# Patient Record
Sex: Male | Born: 1980
Health system: Southern US, Community
[De-identification: ages and names within clinical notes are randomized; demographics above are authoritative.]

## PROBLEM LIST (undated history)

## (undated) DIAGNOSIS — F4310 Post-traumatic stress disorder, unspecified: Secondary | ICD-10-CM

## (undated) DIAGNOSIS — Z765 Malingerer [conscious simulation]: Secondary | ICD-10-CM

## (undated) DIAGNOSIS — M109 Gout, unspecified: Secondary | ICD-10-CM

## (undated) DIAGNOSIS — Z79891 Long term (current) use of opiate analgesic: Secondary | ICD-10-CM

## (undated) DIAGNOSIS — M549 Dorsalgia, unspecified: Secondary | ICD-10-CM

## (undated) DIAGNOSIS — G8929 Other chronic pain: Secondary | ICD-10-CM

## (undated) HISTORY — PX: KNEE SURGERY: SHX244

## (undated) HISTORY — PX: MIDDLE EAR SURGERY: SHX713

---

## 2011-10-06 ENCOUNTER — Encounter (HOSPITAL_COMMUNITY): Payer: Self-pay | Admitting: Emergency Medicine

## 2011-10-06 ENCOUNTER — Emergency Department (HOSPITAL_COMMUNITY)
Admission: EM | Admit: 2011-10-06 | Discharge: 2011-10-07 | Disposition: A | Discharge status: Home / Self Care | Payer: Medicare Other | Attending: Emergency Medicine | Admitting: Emergency Medicine

## 2011-10-06 DIAGNOSIS — R112 Nausea with vomiting, unspecified: Secondary | ICD-10-CM | POA: Insufficient documentation

## 2011-10-06 DIAGNOSIS — IMO0001 Reserved for inherently not codable concepts without codable children: Secondary | ICD-10-CM | POA: Insufficient documentation

## 2011-10-06 DIAGNOSIS — F4310 Post-traumatic stress disorder, unspecified: Secondary | ICD-10-CM | POA: Insufficient documentation

## 2011-10-06 DIAGNOSIS — R51 Headache: Secondary | ICD-10-CM | POA: Insufficient documentation

## 2011-10-06 HISTORY — DX: Post-traumatic stress disorder, unspecified: F43.10

## 2011-10-06 NOTE — ED Notes (Addendum)
Patient complaining of migraine that started around 0000 today; denies history of migraines. Patient reporting light sensitivity, nausea, and vomiting.  Last emesis around 2300.  Patient states that he has taken Tylenol Extra Strength, which has provided little to no relief.

## 2011-10-07 MED ORDER — SODIUM CHLORIDE 0.9 % IV BOLUS (SEPSIS): 1000.0000 mL | Freq: Once | INTRAVENOUS | Status: DC

## 2011-10-07 MED ORDER — DEXAMETHASONE 4 MG PO TABS
10.0000 mg | ORAL_TABLET | Freq: Once | ORAL | Status: AC
  Administered 2011-10-07: 10 mg via ORAL
  Filled 2011-10-07: qty 1

## 2011-10-07 MED ORDER — NAPROXEN 375 MG PO TABS: 375.0000 mg | ORAL_TABLET | Freq: Two times a day (BID) | ORAL | Status: AC

## 2011-10-07 MED ORDER — DIPHENHYDRAMINE HCL 50 MG/ML IJ SOLN: 25.0000 mg | Freq: Once | INTRAMUSCULAR | Status: DC

## 2011-10-07 MED ORDER — DIPHENHYDRAMINE HCL 25 MG PO CAPS
50.0000 mg | ORAL_CAPSULE | Freq: Once | ORAL | Status: AC
  Administered 2011-10-07: 50 mg via ORAL
  Filled 2011-10-07: qty 2

## 2011-10-07 MED ORDER — DEXAMETHASONE SODIUM PHOSPHATE 4 MG/ML IJ SOLN: 10.0000 mg | Freq: Once | INTRAMUSCULAR | Status: DC

## 2011-10-07 MED ORDER — METOCLOPRAMIDE HCL 5 MG/ML IJ SOLN: 10.0000 mg | Freq: Once | INTRAMUSCULAR | Status: DC

## 2011-10-07 MED ORDER — METOCLOPRAMIDE HCL 10 MG PO TABS
10.0000 mg | ORAL_TABLET | Freq: Once | ORAL | Status: AC
  Administered 2011-10-07: 10 mg via ORAL
  Filled 2011-10-07: qty 1

## 2011-10-07 NOTE — Discharge Instructions (Signed)

## 2011-10-07 NOTE — ED Provider Notes (Signed)
History     CSN: [id number]  Arrival date & time 10/06/11  2301   None     Chief Complaint  Patient presents with  . Migraine    (Consider location/radiation/quality/duration/timing/severity/associated sxs/prior treatment) Patient is a 31 y.o. male presenting with migraine.  Migraine Associated symptoms include headaches. Pertinent negatives include no chest pain, no abdominal pain and no shortness of breath.   History provider the patient. Headache started at 12 noon today take extra Tylenol with no relief. Patient has nausea vomiting light bothers his eyes. He is not formally been diagnosed with migraines complains of a migraine-like headache. Gradual onset not worst headache of life. Is moderate to severe. Location right temporal and throbbing in quality without radiation. No weakness or numbness. No fevers or chills. No weakness or numbness. No difficulty with speech or gait. Patient declines an IV is requesting pills and to be discharged home. No known aggravating or alleviating factors otherwise. No anticoagulants. No trauma. Past Medical History  Diagnosis Date  . PTSD (post-traumatic stress disorder)     History reviewed. No pertinent past surgical history.  History reviewed. No pertinent family history.  History  Substance Use Topics  . Smoking status: Current Everyday Smoker -- 0.5 packs/day    Types: Cigarettes  . Smokeless tobacco: Not on file  . Alcohol Use: No      Review of Systems  Constitutional: Negative for fever and chills.  HENT: Negative for neck pain and neck stiffness.   Eyes: Negative for pain.  Respiratory: Negative for shortness of breath.   Cardiovascular: Negative for chest pain.  Gastrointestinal: Negative for abdominal pain.  Genitourinary: Negative for dysuria.  Musculoskeletal: Negative for back pain.  Skin: Negative for rash.  Neurological: Positive for headaches. Negative for tremors, seizures, syncope, speech difficulty, weakness,  light-headedness and numbness.  All other systems reviewed and are negative.    Allergies  Amoxicillin and Penicillins  Home Medications   Current Outpatient Rx  Name Route Sig Dispense Refill  . ALPRAZOLAM 2 MG PO TABS Oral Take 2 mg by mouth at bedtime as needed. For anxiety      BP 124/77  Pulse 66  Temp(Src) 98.3 F (36.8 C) (Oral)  Resp 19  SpO2 99%  Physical Exam  Constitutional: He is oriented to person, place, and time. He appears well-developed and well-nourished.  HENT:  Head: Normocephalic and atraumatic.  Eyes: Conjunctivae and EOM are normal. Pupils are equal, round, and reactive to light.  Neck: Full passive range of motion without pain. Neck supple. No thyromegaly present.       No meningismus  Cardiovascular: Normal rate, regular rhythm, S1 normal, S2 normal and intact distal pulses.   Pulmonary/Chest: Effort normal and breath sounds normal.  Abdominal: Soft. Bowel sounds are normal. There is no tenderness. There is no CVA tenderness.  Musculoskeletal: Normal range of motion.  Neurological: He is alert and oriented to person, place, and time. He has normal strength and normal reflexes. No cranial nerve deficit or sensory deficit. He displays a negative Romberg sign. GCS eye subscore is 4. GCS verbal subscore is 5. GCS motor subscore is 6.       Normal Gait  Skin: Skin is warm and dry. No rash noted. No cyanosis. Nails show no clubbing.  Psychiatric: He has a normal mood and affect. His speech is normal and behavior is normal.    ED Course  Procedures (including critical care time)  IV and further workup declined  Patient provided  pills/ HA cocktail   MDM   Headache without neuro deficits or features to suggest meningitis, subarachnoid hemorrhage or infectious etiology otherwise. Vital signs within normal limits. Medications provided and patient agrees to all discharge and followup instructions, in addition to, strict return precautions. PCP referral  provided        Sunnie Nielsen, MD 10/07/11 [phone]

## 2011-10-07 NOTE — ED Notes (Signed)
Pt c/o headache x's 24 hrs.  Nausea and vomiting. Denies any head injury

## 2012-04-02 ENCOUNTER — Encounter (HOSPITAL_BASED_OUTPATIENT_CLINIC_OR_DEPARTMENT_OTHER): Payer: Self-pay | Admitting: *Deleted

## 2012-04-02 ENCOUNTER — Emergency Department (HOSPITAL_BASED_OUTPATIENT_CLINIC_OR_DEPARTMENT_OTHER)
Admission: EM | Admit: 2012-04-02 | Discharge: 2012-04-03 | Disposition: A | Discharge status: Home / Self Care | Payer: Medicare Other | Attending: Emergency Medicine | Admitting: Emergency Medicine

## 2012-04-02 DIAGNOSIS — L299 Pruritus, unspecified: Secondary | ICD-10-CM

## 2012-04-02 DIAGNOSIS — W57XXXA Bitten or stung by nonvenomous insect and other nonvenomous arthropods, initial encounter: Secondary | ICD-10-CM | POA: Insufficient documentation

## 2012-04-02 DIAGNOSIS — T148 Other injury of unspecified body region: Secondary | ICD-10-CM | POA: Insufficient documentation

## 2012-04-02 DIAGNOSIS — F4310 Post-traumatic stress disorder, unspecified: Secondary | ICD-10-CM | POA: Insufficient documentation

## 2012-04-02 DIAGNOSIS — F17200 Nicotine dependence, unspecified, uncomplicated: Secondary | ICD-10-CM | POA: Insufficient documentation

## 2012-04-02 DIAGNOSIS — Z880 Allergy status to penicillin: Secondary | ICD-10-CM | POA: Insufficient documentation

## 2012-04-02 NOTE — ED Notes (Signed)
Pt states he was bitten by a bug around 6p. C/O bites to neck and shoulder as well as right arm.

## 2012-04-03 MED ORDER — DIPHENHYDRAMINE HCL 25 MG PO TABS: 25.0000 mg | ORAL_TABLET | Freq: Four times a day (QID) | ORAL | Status: DC

## 2012-04-03 MED ORDER — DIPHENHYDRAMINE HCL 25 MG PO CAPS
25.0000 mg | ORAL_CAPSULE | Freq: Once | ORAL | Status: AC
  Administered 2012-04-03: 25 mg via ORAL
  Filled 2012-04-03: qty 1

## 2012-04-03 NOTE — ED Provider Notes (Signed)
History  This chart was scribed for Jeffrey Chad, MD by Ladona Ridgel Day. This patient was seen in room MH10/MH10 and the patient's care was started at 2317.   CSN: [id number]  Arrival date & time 04/02/12  2317   First MD Initiated Contact with Patient 04/02/12 2349      Chief Complaint  Patient presents with  . Insect Bite   Patient is a 31 y.o. male presenting with rash. The history is provided by the patient. No language interpreter was used.  Rash  This is a new problem. The current episode started 6 to 12 hours ago. The problem has not changed since onset.The problem is associated with an insect bite/sting. There has been no fever. The rash is present on the right arm and neck. The patient is experiencing no pain. Associated symptoms include itching. He has tried nothing for the symptoms.   [name] is a 31 y.o. male who presents to the Emergency Department complaining of an insect bite to his neck, shoulder and right arm about 6 hours ago while he was outside and has been itching since. He presents with the insect in a cup but is unsure of what kind of bug it is. He states he has been itching the site of where he was bitten but denies any SOB, throat or tongue swelling.   Past Medical History  Diagnosis Date  . PTSD (post-traumatic stress disorder)     History reviewed. No pertinent past surgical history.  History reviewed. No pertinent family history.  History  Substance Use Topics  . Smoking status: Current Every Day Smoker -- 0.5 packs/day    Types: Cigarettes  . Smokeless tobacco: Not on file  . Alcohol Use: No      Review of Systems  Constitutional: Negative for fever and chills.  Respiratory: Negative for shortness of breath.   Cardiovascular: Negative for chest pain.  Gastrointestinal: Negative for nausea and vomiting.  Skin: Positive for itching and rash.       Itchiness over neck, shoulder and left arm where insect bite occurred.   Neurological:  Negative for weakness.  All other systems reviewed and are negative.    Allergies  Amoxicillin; Penicillins; and Tylenol  Home Medications   Current Outpatient Rx  Name Route Sig Dispense Refill  . ALPRAZOLAM 2 MG PO TABS Oral Take 2 mg by mouth at bedtime as needed. For anxiety    . NAPROXEN 375 MG PO TABS Oral Take 1 tablet (375 mg total) by mouth 2 (two) times daily. 20 tablet 0    Triage Vitals: BP 127/74  Pulse 88  Temp 98.6 F (37 C) (Oral)  Resp 20  Ht 5\' 7"  (1.702 m)  Wt 158 lb (71.668 kg)  BMI 24.75 kg/m2  SpO2 98%  Physical Exam  Nursing note and vitals reviewed. Constitutional: He is oriented to person, place, and time. He appears well-developed and well-nourished. No distress.  HENT:  Head: Normocephalic and atraumatic.  Mouth/Throat: Oropharynx is clear and moist.  Eyes: Conjunctivae normal and EOM are normal.  Neck: Neck supple. No tracheal deviation present.  Cardiovascular: Normal rate, regular rhythm, normal heart sounds and intact distal pulses.   No murmur heard. Pulmonary/Chest: Effort normal and breath sounds normal. No respiratory distress. He has no wheezes. He has no rales.  Abdominal: Soft. Bowel sounds are normal. He exhibits no distension. There is no tenderness. There is no rebound and no guarding.  Musculoskeletal: Normal range of motion.  Neurological: He  is alert and oriented to person, place, and time.  Skin: Skin is warm and dry.       Mild skin excoriation on anterior surface of neck from where he has been itching himself. No skin breaks over anterior neck.   Psychiatric: He has a normal mood and affect. His behavior is normal.    ED Course  Procedures (including critical care time) DIAGNOSTIC STUDIES: Oxygen Saturation is 98% on room air, normal by my interpretation.    COORDINATION OF CARE: At 1205 PM Discussed treatment plan with patient which includes benadryl. Patient agrees.   Labs Reviewed - No data to display No results  found.   No diagnosis found.    MDM  Pt presents for evaluation after a bug bite.  He has a live 6-legged bug in a specimen cup.  IOt is not an ant, spider, or winged insect.  Note no skin breaks or erythema on the left/ant neck where he states he was bit.  Pt appears comfortable and has no evidence on exam of a systeming reaction.  He has no respiratory insufficiency.  Plan symptomatic care.  Discussed indications for immediate return to the ER.   I personally performed the services described in this documentation, which was scribed in my presence. The recorded information has been reviewed and considered.          Jeffrey Chad, MD 04/03/12 [phone]

## 2014-08-14 ENCOUNTER — Emergency Department (HOSPITAL_BASED_OUTPATIENT_CLINIC_OR_DEPARTMENT_OTHER)
Admission: EM | Admit: 2014-08-14 | Discharge: 2014-08-14 | Disposition: A | Discharge status: Home / Self Care | Payer: Medicare Other | Attending: Emergency Medicine | Admitting: Emergency Medicine

## 2014-08-14 ENCOUNTER — Encounter (HOSPITAL_BASED_OUTPATIENT_CLINIC_OR_DEPARTMENT_OTHER): Payer: Self-pay

## 2014-08-14 DIAGNOSIS — Z720 Tobacco use: Secondary | ICD-10-CM | POA: Diagnosis not present

## 2014-08-14 DIAGNOSIS — Z880 Allergy status to penicillin: Secondary | ICD-10-CM | POA: Insufficient documentation

## 2014-08-14 DIAGNOSIS — Z8659 Personal history of other mental and behavioral disorders: Secondary | ICD-10-CM | POA: Diagnosis not present

## 2014-08-14 DIAGNOSIS — K047 Periapical abscess without sinus: Secondary | ICD-10-CM

## 2014-08-14 DIAGNOSIS — K029 Dental caries, unspecified: Secondary | ICD-10-CM | POA: Diagnosis not present

## 2014-08-14 DIAGNOSIS — K0889 Other specified disorders of teeth and supporting structures: Secondary | ICD-10-CM

## 2014-08-14 DIAGNOSIS — K088 Other specified disorders of teeth and supporting structures: Secondary | ICD-10-CM | POA: Insufficient documentation

## 2014-08-14 MED ORDER — CLINDAMYCIN HCL 300 MG PO CAPS: 300.0000 mg | ORAL_CAPSULE | Freq: Four times a day (QID) | ORAL | Status: DC

## 2014-08-14 MED ORDER — TRAMADOL HCL 50 MG PO TABS: 50.0000 mg | ORAL_TABLET | Freq: Four times a day (QID) | ORAL | Status: DC | PRN

## 2014-08-14 MED ORDER — KETOROLAC TROMETHAMINE 60 MG/2ML IM SOLN
60.0000 mg | Freq: Once | INTRAMUSCULAR | Status: AC
  Administered 2014-08-14: 60 mg via INTRAMUSCULAR
  Filled 2014-08-14: qty 2

## 2014-08-14 NOTE — ED Notes (Signed)
Left upper toothache x 2 days.  

## 2014-08-14 NOTE — Discharge Instructions (Signed)
Take clindamycin as prescribed for 1 week. Take tramadol as directed for pain. Follow up with the dentist.  Dental Caries Dental caries (also called tooth decay) is the most common oral disease. It can occur at any age but is more common in children and young adults.  HOW DENTAL CARIES DEVELOPS  The process of decay begins when bacteria and foods (particularly sugars and starches) combine in your mouth to produce plaque. Plaque is a substance that sticks to the hard, outer surface of a tooth (enamel). The bacteria in plaque produce acids that attack enamel. These acids may also attack the root surface of a tooth (cementum) if it is exposed. Repeated attacks dissolve these surfaces and create holes in the tooth (cavities). If left untreated, the acids destroy the other layers of the tooth.  RISK FACTORS  Frequent sipping of sugary beverages.   Frequent snacking on sugary and starchy foods, especially those that easily get stuck in the teeth.   Poor oral hygiene.   Dry mouth.   Substance abuse such as methamphetamine abuse.   Broken or poor-fitting dental restorations.   Eating disorders.   Gastroesophageal reflux disease (GERD).   Certain radiation treatments to the head and neck. SYMPTOMS In the early stages of dental caries, symptoms are seldom present. Sometimes white, chalky areas may be seen on the enamel or other tooth layers. In later stages, symptoms may include:  Pits and holes on the enamel.  Toothache after sweet, hot, or cold foods or drinks are consumed.  Pain around the tooth.  Swelling around the tooth. DIAGNOSIS  Most of the time, dental caries is detected during a regular dental checkup. A diagnosis is made after a thorough medical and dental history is taken and the surfaces of your teeth are checked for signs of dental caries. Sometimes special instruments, such as lasers, are used to check for dental caries. Dental X-ray exams may be taken so that areas  not visible to the eye (such as between the contact areas of the teeth) can be checked for cavities.  TREATMENT  If dental caries is in its early stages, it may be reversed with a fluoride treatment or an application of a remineralizing agent at the dental office. Thorough brushing and flossing at home is needed to aid these treatments. If it is in its later stages, treatment depends on the location and extent of tooth destruction:   If a small area of the tooth has been destroyed, the destroyed area will be removed and cavities will be filled with a material such as gold, silver amalgam, or composite resin.   If a large area of the tooth has been destroyed, the destroyed area will be removed and a cap (crown) will be fitted over the remaining tooth structure.   If the center part of the tooth (pulp) is affected, a procedure called a root canal will be needed before a filling or crown can be placed.   If most of the tooth has been destroyed, the tooth may need to be pulled (extracted). HOME CARE INSTRUCTIONS You can prevent, stop, or reverse dental caries at home by practicing good oral hygiene. Good oral hygiene includes:  Thoroughly cleaning your teeth at least twice a day with a toothbrush and dental floss.   Using a fluoride toothpaste. A fluoride mouth rinse may also be used if recommended by your dentist or health care provider.   Restricting the amount of sugary and starchy foods and sugary liquids you consume.  Avoiding frequent snacking on these foods and sipping of these liquids.   Keeping regular visits with a dentist for checkups and cleanings. PREVENTION   Practice good oral hygiene.  Consider a dental sealant. A dental sealant is a coating material that is applied by your dentist to the pits and grooves of teeth. The sealant prevents food from being trapped in them. It may protect the teeth for several years.  Ask about fluoride supplements if you live in a  community without fluorinated water or with water that has a low fluoride content. Use fluoride supplements as directed by your dentist or health care provider.  Allow fluoride varnish applications to teeth if directed by your dentist or health care provider. Document Released: 02/28/2002 Document Revised: 10/23/2013 Document Reviewed: 06/10/2012 [facility] Patient Information 2015 [place], [place]. This information is not intended to replace advice given to you by your health care provider. Make sure you discuss any questions you have with your health care provider.  Dental Pain A tooth ache may be caused by cavities (tooth decay). Cavities expose the nerve of the tooth to air and hot or cold temperatures. It may come from an infection or abscess (also called a boil or furuncle) around your tooth. It is also often caused by dental caries (tooth decay). This causes the pain you are having. DIAGNOSIS  Your caregiver can diagnose this problem by exam. TREATMENT   If caused by an infection, it may be treated with medications which kill germs (antibiotics) and pain medications as prescribed by your caregiver. Take medications as directed.  Only take over-the-counter or prescription medicines for pain, discomfort, or fever as directed by your caregiver.  Whether the tooth ache today is caused by infection or dental disease, you should see your dentist as soon as possible for further care. SEEK MEDICAL CARE IF: The exam and treatment you received today has been provided on an emergency basis only. This is not a substitute for complete medical or dental care. If your problem worsens or new problems (symptoms) appear, and you are unable to meet with your dentist, call or return to this location. SEEK IMMEDIATE MEDICAL CARE IF:   You have a fever.  You develop redness and swelling of your face, jaw, or neck.  You are unable to open your mouth.  You have severe pain uncontrolled by pain medicine. MAKE  SURE YOU:   Understand these instructions.  Will watch your condition.  Will get help right away if you are not doing well or get worse. Document Released: 06/08/2005 Document Revised: 08/31/2011 Document Reviewed: 01/25/2008 [facility]ExitCare Patient Information 2015 [place], [place]. This information is not intended to replace advice given to you by your health care provider. Make sure you discuss any questions you have with your health care provider.

## 2014-08-14 NOTE — ED Provider Notes (Signed)
CSN: [id number]     Arrival date & time 08/14/14  1757 History   First MD Initiated Contact with Patient 08/14/14 1822     Chief Complaint  Patient presents with  . Dental Pain     (Consider location/radiation/quality/duration/timing/severity/associated sxs/prior Treatment) HPI Comments: 34 year old male presenting with left-sided dental pain 2 days. Pain worse with chewing. He has tried over-the-counter medication with no relief. States his face feels swollen on the left side. He does not have a dentist. He is a smoker. Denies difficulty swallowing.  Patient is a 34 y.o. male presenting with tooth pain. The history is provided by the patient.  Dental Pain Associated symptoms: facial swelling     Past Medical History  Diagnosis Date  . PTSD (post-traumatic stress disorder)    History reviewed. No pertinent past surgical history. No family history on file. History  Substance Use Topics  . Smoking status: Current Every Day Smoker -- 0.50 packs/day    Types: Cigarettes  . Smokeless tobacco: Not on file  . Alcohol Use: No    Review of Systems  Constitutional: Negative.   HENT: Positive for dental problem and facial swelling.   Respiratory: Negative for choking.   Gastrointestinal: Negative for vomiting.  Skin: Negative.       Allergies  Amoxicillin; Penicillins; and Tylenol  Home Medications   Prior to Admission medications   Medication Sig Start Date End Date Taking? Authorizing Provider  clindamycin (CLEOCIN) 300 MG capsule Take 1 capsule (300 mg total) by mouth 4 (four) times daily. X 7 days 08/14/14   Kathrynn Speedobyn M Tayten Heber, PA-C  traMADol (ULTRAM) 50 MG tablet Take 1 tablet (50 mg total) by mouth every 6 (six) hours as needed. 08/14/14   Curtiss Mahmood M Mikaelyn Arthurs, PA-C   BP 131/73 mmHg  Pulse 92  Temp(Src) 98 F (36.7 C) (Oral)  Resp 16  Ht 5\' 7"  (1.702 m)  Wt 185 lb (83.915 kg)  BMI 28.97 kg/m2  SpO2 100% Physical Exam  Constitutional: He is oriented to person, place, and time.  He appears well-developed and well-nourished. No distress.  HENT:  Head: Normocephalic and atraumatic.  Mouth/Throat: Uvula is midline.    Poor dentition. Multiple dental caries.  Eyes: Conjunctivae and EOM are normal.  Neck: Normal range of motion. Neck supple.  Cardiovascular: Normal rate, regular rhythm and normal heart sounds.   Pulmonary/Chest: Effort normal and breath sounds normal.  Musculoskeletal: Normal range of motion. He exhibits no edema.  Neurological: He is alert and oriented to person, place, and time.  Skin: Skin is warm and dry.  Psychiatric: He has a normal mood and affect. His behavior is normal.  Nursing note and vitals reviewed.   ED Course  Procedures (including critical care time) Labs Review Labs Reviewed - No data to display  Imaging Review No results found.   EKG Interpretation None      MDM   Final diagnoses:  Pain, dental  Dental infection  Dental caries    Dental pain associated with dental infection. No evidence of dental abscess. Patient is afebrile, non toxic appearing and swallowing secretions well. I gave patient referral to dentist and stressed the importance of dental follow up for ultimate management of dental pain. I will also give clinda (PCN allergy) and pain control. Patient voices understanding and is agreeable to plan.   Kathrynn SpeedRobyn M Mirl Hillery, PA-C 08/14/14 1831  Rolland PorterMark James, MD 08/15/14 [phone]

## 2014-09-10 ENCOUNTER — Emergency Department (HOSPITAL_BASED_OUTPATIENT_CLINIC_OR_DEPARTMENT_OTHER)
Admission: EM | Admit: 2014-09-10 | Discharge: 2014-09-10 | Disposition: A | Discharge status: Home / Self Care | Payer: Medicare Other

## 2014-09-10 NOTE — ED Notes (Signed)
No answer for triage, unable to find.

## 2014-09-10 NOTE — ED Notes (Signed)
No answer

## 2014-11-26 ENCOUNTER — Encounter (HOSPITAL_BASED_OUTPATIENT_CLINIC_OR_DEPARTMENT_OTHER): Payer: Self-pay | Admitting: *Deleted

## 2014-11-26 ENCOUNTER — Emergency Department (HOSPITAL_BASED_OUTPATIENT_CLINIC_OR_DEPARTMENT_OTHER)
Admission: EM | Admit: 2014-11-26 | Discharge: 2014-11-26 | Disposition: A | Discharge status: Home / Self Care | Payer: Medicare Other | Attending: Emergency Medicine | Admitting: Emergency Medicine

## 2014-11-26 DIAGNOSIS — K0889 Other specified disorders of teeth and supporting structures: Secondary | ICD-10-CM

## 2014-11-26 DIAGNOSIS — Z792 Long term (current) use of antibiotics: Secondary | ICD-10-CM | POA: Insufficient documentation

## 2014-11-26 DIAGNOSIS — Z720 Tobacco use: Secondary | ICD-10-CM | POA: Insufficient documentation

## 2014-11-26 DIAGNOSIS — Z880 Allergy status to penicillin: Secondary | ICD-10-CM | POA: Diagnosis not present

## 2014-11-26 DIAGNOSIS — K088 Other specified disorders of teeth and supporting structures: Secondary | ICD-10-CM | POA: Diagnosis present

## 2014-11-26 DIAGNOSIS — K029 Dental caries, unspecified: Secondary | ICD-10-CM | POA: Insufficient documentation

## 2014-11-26 DIAGNOSIS — Z8659 Personal history of other mental and behavioral disorders: Secondary | ICD-10-CM | POA: Insufficient documentation

## 2014-11-26 MED ORDER — TRAMADOL HCL 50 MG PO TABS: 50.0000 mg | ORAL_TABLET | Freq: Four times a day (QID) | ORAL | Status: DC | PRN

## 2014-11-26 MED ORDER — CLINDAMYCIN HCL 150 MG PO CAPS: 300.0000 mg | ORAL_CAPSULE | Freq: Three times a day (TID) | ORAL | Status: DC

## 2014-11-26 MED ORDER — TRAMADOL HCL 50 MG PO TABS
50.0000 mg | ORAL_TABLET | Freq: Once | ORAL | Status: AC
  Administered 2014-11-26: 50 mg via ORAL
  Filled 2014-11-26: qty 1

## 2014-11-26 MED ORDER — CLINDAMYCIN HCL 150 MG PO CAPS
300.0000 mg | ORAL_CAPSULE | Freq: Once | ORAL | Status: AC
  Administered 2014-11-26: 300 mg via ORAL
  Filled 2014-11-26: qty 2

## 2014-11-26 NOTE — ED Provider Notes (Signed)
CSN: [id number]     Arrival date & time 11/26/14  1226 History   First MD Initiated Contact with Patient 11/26/14 1342     Chief Complaint  Patient presents with  . Dental Pain     (Consider location/radiation/quality/duration/timing/severity/associated sxs/prior Treatment) HPI Comments: Patient is a 34 year old male past medical history significant for tobacco abuse presenting to the emergency department for continued upper dental pain without radiation. Describes pain as sharp throbbing. Has tried Tylenol with no improvement. Denies any fevers, shortness breath, difficulty swallowing. Patient has not followed up with a dentist yet. Modifying factors identified.  Patient is a 34 y.o. male presenting with tooth pain.  Dental Pain Location:  Upper Quality:  Sharp and throbbing Timing:  Constant Progression:  Unchanged Chronicity:  Recurrent Context: dental caries, dental fracture and poor dentition   Relieved by:  Nothing Worsened by:  Cold food/drink and hot food/drink Ineffective treatments:  Acetaminophen Risk factors: lack of dental care and smoking     Past Medical History  Diagnosis Date  . PTSD (post-traumatic stress disorder)    History reviewed. No pertinent past surgical history. No family history on file. History  Substance Use Topics  . Smoking status: Current Every Day Smoker -- 0.50 packs/day    Types: Cigarettes  . Smokeless tobacco: Not on file  . Alcohol Use: No    Review of Systems  HENT: Positive for dental problem.   All other systems reviewed and are negative.     Allergies  Amoxicillin; Penicillins; and Tylenol  Home Medications   Prior to Admission medications   Medication Sig Start Date End Date Taking? Authorizing Provider  clindamycin (CLEOCIN) 150 MG capsule Take 2 capsules (300 mg total) by mouth 3 (three) times daily. May dispense as  capsules 11/26/14   Francee Piccolo, PA-C  clindamycin (CLEOCIN) 300 MG capsule Take 1 capsule  (300 mg total) by mouth 4 (four) times daily. X 7 days 08/14/14   Kathrynn Speed, PA-C  traMADol (ULTRAM) 50 MG tablet Take 1 tablet (50 mg total) by mouth every 6 (six) hours as needed. 08/14/14   Kathrynn Speed, PA-C  traMADol (ULTRAM) 50 MG tablet Take 1 tablet (50 mg total) by mouth every 6 (six) hours as needed. 11/26/14   Alexi Geibel, PA-C   BP 127/80 mmHg  Pulse 77  Temp(Src) 97.9 F (36.6 C) (Oral)  Resp 20  Ht  (1.702 m)  Wt 195 lb (88.451 kg)  BMI 30.53 kg/m2  SpO2 97% Physical Exam  Constitutional: He is oriented to person, place, and time. He appears well-developed and well-nourished. No distress.  HENT:  Head: Normocephalic and atraumatic.  Right Ear: External ear normal.  Left Ear: External ear normal.  Nose: Nose normal.  Mouth/Throat: Uvula is midline and mucous membranes are normal. No trismus in the jaw. Abnormal dentition. Dental caries present. No uvula swelling. No oropharyngeal exudate.  Submental and sublingual spaces are soft.  Eyes: Conjunctivae are normal.  Neck: Normal range of motion. Neck supple.  Cardiovascular: Normal rate, regular rhythm and normal heart sounds.   Pulmonary/Chest: Effort normal and breath sounds normal.  Neurological: He is alert and oriented to person, place, and time.  Skin: Skin is warm and dry. He is not diaphoretic.  Psychiatric: He has a normal mood and affect.  Nursing note and vitals reviewed.   ED Course  Procedures (including critical care time) Medications  traMADol (ULTRAM) tablet 50 mg (50 mg Oral Given 11/26/14 1423)  clindamycin (  CLEOCIN) capsule 300 mg (300 mg Oral Given 11/26/14 1423)    Labs Review Labs Reviewed - No data to display  Imaging Review No results found.   EKG Interpretation None      MDM   Final diagnoses:  Pain, dental    Filed Vitals:   11/26/14 1231  BP: 127/80  Pulse: 77  Temp: 97.9 F (36.6 C)  Resp: 20   Afebrile, NAD, non-toxic appearing, AAOx4.   Patient with  toothache.  No gross abscess.  Exam unconcerning for Ludwig's angina or spread of infection.  Will treat with clindamycinand pain medicine.  Urged patient to follow-up with dentist.   Patient is stable at time of discharge    Francee PiccoloJennifer Nimesh Riolo, PA-C 11/26/14 1430  Toy CookeyMegan Docherty, MD 11/26/14 [phone]

## 2014-11-26 NOTE — Discharge Instructions (Signed)
Please follow up with Dr. Mayford Knifeurner to schedule a follow up appointment. Please take your antibiotic until completion. Please take pain medication and/or muscle relaxants as prescribed and as needed for pain. Please do not drive on narcotic pain medication or on muscle relaxants. Please read all discharge instructions and return precautions.    Dental Pain A tooth ache may be caused by cavities (tooth decay). Cavities expose the nerve of the tooth to air and hot or cold temperatures. It may come from an infection or abscess (also called a boil or furuncle) around your tooth. It is also often caused by dental caries (tooth decay). This causes the pain you are having. DIAGNOSIS  Your caregiver can diagnose this problem by exam. TREATMENT   If caused by an infection, it may be treated with medications which kill germs (antibiotics) and pain medications as prescribed by your caregiver. Take medications as directed.  Only take over-the-counter or prescription medicines for pain, discomfort, or fever as directed by your caregiver.  Whether the tooth ache today is caused by infection or dental disease, you should see your dentist as soon as possible for further care. SEEK MEDICAL CARE IF: The exam and treatment you received today has been provided on an emergency basis only. This is not a substitute for complete medical or dental care. If your problem worsens or new problems (symptoms) appear, and you are unable to meet with your dentist, call or return to this location. SEEK IMMEDIATE MEDICAL CARE IF:   You have a fever.  You develop redness and swelling of your face, jaw, or neck.  You are unable to open your mouth.  You have severe pain uncontrolled by pain medicine. MAKE SURE YOU:   Understand these instructions.  Will watch your condition.  Will get help right away if you are not doing well or get worse. Document Released: 06/08/2005 Document Revised: 08/31/2011 Document Reviewed:  01/25/2008 [facility] Patient Information 2015 [employer], [place]. This information is not intended to replace advice given to you by your health care provider. Make sure you discuss any questions you have with your health care provider.

## 2014-11-26 NOTE — ED Notes (Signed)
Dental pain. 

## 2015-01-19 ENCOUNTER — Encounter (HOSPITAL_BASED_OUTPATIENT_CLINIC_OR_DEPARTMENT_OTHER): Payer: Self-pay | Admitting: Emergency Medicine

## 2015-01-19 ENCOUNTER — Emergency Department (HOSPITAL_BASED_OUTPATIENT_CLINIC_OR_DEPARTMENT_OTHER)
Admission: EM | Admit: 2015-01-19 | Discharge: 2015-01-19 | Disposition: A | Discharge status: Home / Self Care | Payer: Medicare Other | Attending: Emergency Medicine | Admitting: Emergency Medicine

## 2015-01-19 DIAGNOSIS — Z880 Allergy status to penicillin: Secondary | ICD-10-CM | POA: Insufficient documentation

## 2015-01-19 DIAGNOSIS — Z720 Tobacco use: Secondary | ICD-10-CM | POA: Insufficient documentation

## 2015-01-19 DIAGNOSIS — Z8659 Personal history of other mental and behavioral disorders: Secondary | ICD-10-CM | POA: Insufficient documentation

## 2015-01-19 DIAGNOSIS — K0889 Other specified disorders of teeth and supporting structures: Secondary | ICD-10-CM

## 2015-01-19 DIAGNOSIS — K088 Other specified disorders of teeth and supporting structures: Secondary | ICD-10-CM | POA: Diagnosis present

## 2015-01-19 DIAGNOSIS — K08409 Partial loss of teeth, unspecified cause, unspecified class: Secondary | ICD-10-CM | POA: Diagnosis not present

## 2015-01-19 MED ORDER — CLINDAMYCIN HCL 150 MG PO CAPS: 150.0000 mg | ORAL_CAPSULE | Freq: Four times a day (QID) | ORAL | Status: DC

## 2015-01-19 MED ORDER — CLINDAMYCIN HCL 150 MG PO CAPS
150.0000 mg | ORAL_CAPSULE | Freq: Once | ORAL | Status: AC
  Administered 2015-01-19: 150 mg via ORAL
  Filled 2015-01-19: qty 1

## 2015-01-19 NOTE — ED Notes (Signed)
Patient had wisdom tooth removed on Tuesday.  Patient c/o continued pain and inability to eat.

## 2015-01-19 NOTE — ED Notes (Signed)
Discharge instructions and prescriptions given and reviewed with patient.  Patient verbalized understanding to take medications as directed and to follow up with dentist as needed.  Patient discharged home in good condition.

## 2015-01-19 NOTE — ED Provider Notes (Signed)
CSN: [id number]     Arrival date & time 01/19/15  4098 History   First MD Initiated Contact with Jeffrey Crawford 01/19/15 0403     Chief Complaint  Jeffrey Crawford presents with  . Dental Pain     (Consider location/radiation/quality/duration/timing/severity/associated sxs/prior Treatment) Jeffrey Crawford is a 34 y.o. male presenting with tooth pain. The history is provided by the Jeffrey Crawford.  Dental Pain He states that he had his left upper wisdom tooth removed 3 days ago and he is continuing to have pain. He states he was told to eat soft foods for 2 days and then eat anything that he wanted. However, when he she is on that side, it is painful. He rates pain at 9/10. He told me that the dentist had given him Lortab 5 mg but it is not helping the pain.  Past Medical History  Diagnosis Date  . PTSD (post-traumatic stress disorder)    History reviewed. No pertinent past surgical history. No family history on file. History  Substance Use Topics  . Smoking status: Current Every Day Smoker -- 0.50 packs/day    Types: Cigarettes  . Smokeless tobacco: Not on file  . Alcohol Use: No    Review of Systems  All other systems reviewed and are negative.     Allergies  Amoxicillin; Penicillins; and Tylenol  Home Medications   Prior to Admission medications   Medication Sig Start Date End Date Taking? Authorizing Provider  clindamycin (CLEOCIN) 150 MG capsule Take 1 capsule (150 mg total) by mouth 4 (four) times daily. May dispense as  capsules 01/19/15   Dione Booze, MD   BP 128/82 mmHg  Pulse 88  Temp(Src) 98.1 F (36.7 C) (Oral)  Resp 20  Ht  (1.702 m)  Wt 185 lb (83.915 kg)  BMI 28.97 kg/m2  SpO2 98% Physical Exam  Nursing note and vitals reviewed.  34 year old male, resting comfortably and in no acute distress. Vital signs are normal. Oxygen saturation is 98%, which is normal. Head is normocephalic and atraumatic. PERRLA, EOMI. Oropharynx is clear. Tooth #16 has been extracted. The  extraction site appears to be healing appropriately. No evidence of bleeding and no swelling. It is tender to palpation. There is a bony prominence under 2 #15 which is tender as well. Overall quality of dentition is poor. Neck is nontender and supple without adenopathy or JVD. Back is nontender and there is no CVA tenderness. Lungs are clear without rales, wheezes, or rhonchi. Chest is nontender. Heart has regular rate and rhythm without murmur. Abdomen is soft, flat, nontender without masses or hepatosplenomegaly and peristalsis is normoactive. Extremities have no cyanosis or edema, full range of motion is present. Skin is warm and dry without rash. Neurologic: Mental status is normal, cranial nerves are intact, there are no motor or sensory deficits.  ED Course  Procedures (including critical care time)  MDM   Final diagnoses:  Pain, dental    Post extraction dental pain without evidence of dry socket. I reviewed his past records and he has 2 prior ED visits for dental pain which are presumably related to the same tooth. I reviewed his record on the [place] controlled substance reporting system website and he gets monthly prescriptions for oxycodone-acetaminophen 7.5-325 and last prescription was filled on July 13. I asked the Jeffrey Crawford about this since he stated that that was a 5 mg Lortab that is not effective for pain. He then stated that he actually couldn't was not able to get the  5 mg Lortab filled. I am not quite to give him any narcotics since he is on monthly narcotic prescription from his PCP. However, I will put him on a short course of interbody Sisson case there is problem with low-grade infection. He is discharged with prescription for clindamycin and is referred back to his dentist.    Dione Booze, MD 01/19/15 [phone]

## 2015-01-19 NOTE — Discharge Instructions (Signed)
The place where your wisdom tooth was removed looks like it is healing appropriately. Please follow-up with your dentist for further issues related to that. Take anabolic until it is completed.  Clindamycin capsules What is this medicine? CLINDAMYCIN (KLIN da MYE sin) is a lincosamide antibiotic. It is used to treat certain kinds of bacterial infections. It will not work for colds, flu, or other viral infections. This medicine may be used for other purposes; ask your health care provider or pharmacist if you have questions. COMMON BRAND NAME(S): Cleocin What should I tell my health care provider before I take this medicine? They need to know if you have any of these conditions: -kidney disease -liver disease -stomach problems like colitis -an unusual or allergic reaction to clindamycin, lincomycin, or other medicines, foods, dyes like tartrazine or preservatives -pregnant or trying to get pregnant -breast-feeding How should I use this medicine? Take this medicine by mouth with a full glass of water. Follow the directions on the prescription label. You can take this medicine with food or on an empty stomach. If the medicine upsets your stomach, take it with food. Take your medicine at regular intervals. Do not take your medicine more often than directed. Take all of your medicine as directed even if you think your are better. Do not skip doses or stop your medicine early. Talk to your pediatrician regarding the use of this medicine in children. Special care may be needed. Overdosage: If you think you have taken too much of this medicine contact a poison control center or emergency room at once. NOTE: This medicine is only for you. Do not share this medicine with others. What if I miss a dose? If you miss a dose, take it as soon as you can. If it is almost time for your next dose, take only that dose. Do not take double or extra doses. What may interact with this medicine? -birth control  pills -chloramphenicol -erythromycin -kaolin products This list may not describe all possible interactions. Give your health care provider a list of all the medicines, herbs, non-prescription drugs, or dietary supplements you use. Also tell them if you smoke, drink alcohol, or use illegal drugs. Some items may interact with your medicine. What should I watch for while using this medicine? Tell your doctor or healthcare professional if your symptoms do not start to get better or if they get worse. Do not treat diarrhea with over the counter products. Contact your doctor if you have diarrhea that lasts more than 2 days or if it is severe and watery. What side effects may I notice from receiving this medicine? Side effects that you should report to your doctor or health care professional as soon as possible: -allergic reactions like skin rash, itching or hives, swelling of the face, lips, or tongue -dark urine -pain on swallowing -redness, blistering, peeling or loosening of the skin, including inside the mouth -unusual bleeding or bruising -unusually weak or tired -yellowing of eyes or skin Side effects that usually do not require medical attention (report to your doctor or health care professional if they continue or are bothersome): -diarrhea -itching in the rectal or genital area -joint pain -nausea, vomiting -stomach pain This list may not describe all possible side effects. Call your doctor for medical advice about side effects. You may report side effects to FDA at 1-800-FDA-1088. Where should I keep my medicine? Keep out of the reach of children. Store at room temperature between 20 and 25 degrees C (68  and 77 degrees F). Throw away any unused medicine after the expiration date. NOTE: This sheet is a summary. It may not cover all possible information. If you have questions about this medicine, talk to your doctor, pharmacist, or health care provider.  2015, [employer].  (2013-01-12 16:12:32)

## 2015-01-31 ENCOUNTER — Emergency Department (HOSPITAL_BASED_OUTPATIENT_CLINIC_OR_DEPARTMENT_OTHER)
Admission: EM | Admit: 2015-01-31 | Discharge: 2015-02-01 | Disposition: A | Discharge status: Home / Self Care | Payer: Medicare Other | Attending: Emergency Medicine | Admitting: Emergency Medicine

## 2015-01-31 ENCOUNTER — Encounter (HOSPITAL_BASED_OUTPATIENT_CLINIC_OR_DEPARTMENT_OTHER): Payer: Self-pay | Admitting: *Deleted

## 2015-01-31 DIAGNOSIS — G8929 Other chronic pain: Secondary | ICD-10-CM | POA: Insufficient documentation

## 2015-01-31 DIAGNOSIS — Z8659 Personal history of other mental and behavioral disorders: Secondary | ICD-10-CM | POA: Insufficient documentation

## 2015-01-31 DIAGNOSIS — S24109A Unspecified injury at unspecified level of thoracic spinal cord, initial encounter: Secondary | ICD-10-CM | POA: Diagnosis not present

## 2015-01-31 DIAGNOSIS — X58XXXA Exposure to other specified factors, initial encounter: Secondary | ICD-10-CM | POA: Insufficient documentation

## 2015-01-31 DIAGNOSIS — Z880 Allergy status to penicillin: Secondary | ICD-10-CM | POA: Diagnosis not present

## 2015-01-31 DIAGNOSIS — Z792 Long term (current) use of antibiotics: Secondary | ICD-10-CM | POA: Diagnosis not present

## 2015-01-31 DIAGNOSIS — Y998 Other external cause status: Secondary | ICD-10-CM | POA: Diagnosis not present

## 2015-01-31 DIAGNOSIS — S39012A Strain of muscle, fascia and tendon of lower back, initial encounter: Secondary | ICD-10-CM | POA: Insufficient documentation

## 2015-01-31 DIAGNOSIS — Z720 Tobacco use: Secondary | ICD-10-CM | POA: Insufficient documentation

## 2015-01-31 DIAGNOSIS — Y929 Unspecified place or not applicable: Secondary | ICD-10-CM | POA: Insufficient documentation

## 2015-01-31 DIAGNOSIS — Y9389 Activity, other specified: Secondary | ICD-10-CM | POA: Diagnosis not present

## 2015-01-31 DIAGNOSIS — S3992XA Unspecified injury of lower back, initial encounter: Secondary | ICD-10-CM | POA: Diagnosis present

## 2015-01-31 NOTE — ED Notes (Signed)
Back pain. States he was lifting a couch and the pain started afterward.

## 2015-02-01 DIAGNOSIS — S39012A Strain of muscle, fascia and tendon of lower back, initial encounter: Secondary | ICD-10-CM | POA: Diagnosis not present

## 2015-02-01 MED ORDER — ONDANSETRON 4 MG PO TBDP
4.0000 mg | ORAL_TABLET | Freq: Once | ORAL | Status: AC
  Administered 2015-02-01: 4 mg via ORAL
  Filled 2015-02-01: qty 1

## 2015-02-01 MED ORDER — HYDROMORPHONE HCL 1 MG/ML IJ SOLN
2.0000 mg | Freq: Once | INTRAMUSCULAR | Status: AC
  Administered 2015-02-01: 2 mg via INTRAMUSCULAR
  Filled 2015-02-01: qty 2

## 2015-02-01 NOTE — ED Notes (Signed)
MD at bedside. 
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
## 2015-03-16 ENCOUNTER — Encounter (HOSPITAL_BASED_OUTPATIENT_CLINIC_OR_DEPARTMENT_OTHER): Payer: Self-pay | Admitting: Emergency Medicine

## 2015-03-16 ENCOUNTER — Emergency Department (HOSPITAL_BASED_OUTPATIENT_CLINIC_OR_DEPARTMENT_OTHER)
Admission: EM | Admit: 2015-03-16 | Discharge: 2015-03-16 | Disposition: A | Discharge status: Home / Self Care | Payer: Medicare Other | Attending: Emergency Medicine | Admitting: Emergency Medicine

## 2015-03-16 DIAGNOSIS — Z8659 Personal history of other mental and behavioral disorders: Secondary | ICD-10-CM | POA: Diagnosis not present

## 2015-03-16 DIAGNOSIS — Z880 Allergy status to penicillin: Secondary | ICD-10-CM | POA: Insufficient documentation

## 2015-03-16 DIAGNOSIS — Z720 Tobacco use: Secondary | ICD-10-CM | POA: Insufficient documentation

## 2015-03-16 DIAGNOSIS — R112 Nausea with vomiting, unspecified: Secondary | ICD-10-CM | POA: Diagnosis not present

## 2015-03-16 DIAGNOSIS — R197 Diarrhea, unspecified: Secondary | ICD-10-CM | POA: Diagnosis not present

## 2015-03-16 MED ORDER — ONDANSETRON 4 MG PO TBDP: ORAL_TABLET | ORAL | Status: DC

## 2015-03-16 NOTE — ED Notes (Signed)
Pt c/o vomiting and diarrhea since 2am this morning.  Statess coworker yesterday had similar symptoms.  Stomach "cramping".

## 2015-03-16 NOTE — ED Provider Notes (Signed)
CSN: [id number]     Arrival date & time 03/16/15  0741 History   First MD Initiated Contact with Patient 03/16/15 0801     Chief Complaint  Patient presents with  . Diarrhea  . Emesis     (Consider location/radiation/quality/duration/timing/severity/associated sxs/prior Treatment) Patient is a 34 y.o. male presenting with diarrhea and vomiting.  Diarrhea Quality:  Semi-solid Severity:  Moderate Onset quality:  Gradual Duration:  6 hours Timing:  Constant Progression:  Unchanged Relieved by:  Nothing Worsened by:  Nothing tried Associated symptoms: vomiting   Emesis Severity:  Moderate Duration:  6 hours Quality:  Stomach contents Able to tolerate:  Liquids Progression:  Unchanged Chronicity:  New Recent urination:  Normal Relieved by:  Nothing Worsened by:  Nothing tried Ineffective treatments:  None tried Associated symptoms: diarrhea   Risk factors: sick contacts     Past Medical History  Diagnosis Date  . PTSD (post-traumatic stress disorder)    Past Surgical History  Procedure Laterality Date  . Knee surgery Bilateral   . Middle ear surgery     No family history on file. Social History  Substance Use Topics  . Smoking status: Current Some Day Smoker -- 0.50 packs/day    Types: Cigarettes  . Smokeless tobacco: None  . Alcohol Use: No    Review of Systems  Gastrointestinal: Positive for vomiting and diarrhea.  All other systems reviewed and are negative.     Allergies  Amoxicillin; Penicillins; and Tylenol  Home Medications   Prior to Admission medications   Medication Sig Start Date End Date Taking? Authorizing Provider  diphenhydrAMINE (SOMINEX) 25 MG tablet Take 25 mg by mouth at bedtime as needed for sleep.   Yes Historical Provider, MD  oxycodone-acetaminophen (PERCOCET) 2.5-325 MG per tablet Take 1 tablet by mouth every 4 (four) hours as needed for pain.   Yes Historical Provider, MD  clindamycin (CLEOCIN) 150 MG capsule Take 1 capsule  (150 mg total) by mouth 4 (four) times daily. May dispense as  capsules 01/19/15   Dione Booze, MD  ondansetron (ZOFRAN ODT) 4 MG disintegrating tablet  ODT q4 hours prn nausea/vomit 03/16/15   Mirian Mo, MD   BP 133/82 mmHg  Pulse 79  Temp(Src) 98.3 F (36.8 C) (Oral)  Resp 18  Ht  (1.702 m)  Wt 185 lb (83.915 kg)  BMI 28.97 kg/m2  SpO2 99% Physical Exam  Constitutional: He is oriented to person, place, and time. He appears well-developed and well-nourished.  HENT:  Head: Normocephalic and atraumatic.  Eyes: Conjunctivae and EOM are normal.  Neck: Normal range of motion. Neck supple.  Cardiovascular: Normal rate, regular rhythm and normal heart sounds.   Pulmonary/Chest: Effort normal and breath sounds normal. No respiratory distress.  Abdominal: He exhibits no distension. There is no tenderness. There is no rebound and no guarding.  Musculoskeletal: Normal range of motion.  Neurological: He is alert and oriented to person, place, and time.  Skin: Skin is warm and dry.  Vitals reviewed.   ED Course  Procedures (including critical care time) Labs Review Labs Reviewed - No data to display  Imaging Review No results found. I have personally reviewed and evaluated these images and lab results as part of my medical decision-making.   EKG Interpretation None      MDM   Final diagnoses:  Non-intractable vomiting with nausea, vomiting of unspecified type  Diarrhea    34 y.o. male without pertinent PMH presents with vomiting, diarrhea in setting of  known sick contact with same at work.  No abd pain apart from cramping associated with vomiting and diarrhea.  No blood, no fever, no respiratory symptoms.  Well appearing, benign exam.  Likely gastroenteritis, given standard return precautions.    I have reviewed all laboratory and imaging studies if ordered as above  1. Non-intractable vomiting with nausea, vomiting of unspecified type   2. Diarrhea          Mirian Mo, MD 03/16/15 [phone]

## 2015-03-16 NOTE — Discharge Instructions (Signed)

## 2015-04-06 ENCOUNTER — Emergency Department (HOSPITAL_BASED_OUTPATIENT_CLINIC_OR_DEPARTMENT_OTHER)
Admission: EM | Admit: 2015-04-06 | Discharge: 2015-04-06 | Disposition: A | Discharge status: Home / Self Care | Payer: Medicare Other | Attending: Physician Assistant | Admitting: Physician Assistant

## 2015-04-06 ENCOUNTER — Encounter (HOSPITAL_BASED_OUTPATIENT_CLINIC_OR_DEPARTMENT_OTHER): Payer: Self-pay | Admitting: *Deleted

## 2015-04-06 DIAGNOSIS — Z792 Long term (current) use of antibiotics: Secondary | ICD-10-CM | POA: Diagnosis not present

## 2015-04-06 DIAGNOSIS — Z880 Allergy status to penicillin: Secondary | ICD-10-CM | POA: Insufficient documentation

## 2015-04-06 DIAGNOSIS — Z720 Tobacco use: Secondary | ICD-10-CM | POA: Diagnosis not present

## 2015-04-06 DIAGNOSIS — G43809 Other migraine, not intractable, without status migrainosus: Secondary | ICD-10-CM

## 2015-04-06 DIAGNOSIS — G43909 Migraine, unspecified, not intractable, without status migrainosus: Secondary | ICD-10-CM | POA: Diagnosis present

## 2015-04-06 DIAGNOSIS — Z8659 Personal history of other mental and behavioral disorders: Secondary | ICD-10-CM | POA: Diagnosis not present

## 2015-04-06 MED ORDER — PROCHLORPERAZINE EDISYLATE 5 MG/ML IJ SOLN
10.0000 mg | Freq: Once | INTRAMUSCULAR | Status: AC
  Administered 2015-04-06: 10 mg via INTRAVENOUS
  Filled 2015-04-06: qty 2

## 2015-04-06 MED ORDER — DIPHENHYDRAMINE HCL 50 MG/ML IJ SOLN
25.0000 mg | Freq: Once | INTRAMUSCULAR | Status: AC
  Administered 2015-04-06: 25 mg via INTRAVENOUS
  Filled 2015-04-06: qty 1

## 2015-04-06 MED ORDER — SODIUM CHLORIDE 0.9 % IV BOLUS (SEPSIS)
1000.0000 mL | Freq: Once | INTRAVENOUS | Status: AC
  Administered 2015-04-06: 1000 mL via INTRAVENOUS

## 2015-04-06 NOTE — ED Provider Notes (Signed)
CSN: [id number]     Arrival date & time 04/06/15  4098 History   First MD Initiated Contact with Patient 04/06/15 0802     Chief Complaint  Patient presents with  . Migraine     (Consider location/radiation/quality/duration/timing/severity/associated sxs/prior Treatment) HPI    Patient is a 34 year old male presenting with migraine. Patient has history of migraine. He states feels like his normal migraine.It started  last night at 9 PM, has gotten worse. He is able to sleep until 2 AM when he took  Tylenol. This helped him sleep in additional 2 hours. He has a residual headache.    Past Medical History  Diagnosis Date  . PTSD (post-traumatic stress disorder)    Past Surgical History  Procedure Laterality Date  . Knee surgery Bilateral   . Middle ear surgery     No family history on file. Social History  Substance Use Topics  . Smoking status: Current Some Day Smoker -- 0.50 packs/day    Types: Cigarettes  . Smokeless tobacco: None  . Alcohol Use: No    Review of Systems  Constitutional: Negative for activity change.  Respiratory: Negative for shortness of breath.   Cardiovascular: Negative for chest pain.  Gastrointestinal: Negative for abdominal pain.  Neurological: Positive for headaches.      Allergies  Amoxicillin and Penicillins  Home Medications   Prior to Admission medications   Medication Sig Start Date End Date Taking? Authorizing Provider  oxyCODONE-acetaminophen (PERCOCET) 7.5-325 MG tablet Take 1 tablet by mouth every 8 (eight) hours as needed for severe pain.   Yes Historical Provider, MD  clindamycin (CLEOCIN) 150 MG capsule Take 1 capsule (150 mg total) by mouth 4 (four) times daily. May dispense as  capsules 01/19/15   Dione Booze, MD  diphenhydrAMINE (SOMINEX) 25 MG tablet Take 25 mg by mouth at bedtime as needed for sleep.    Historical Provider, MD  ondansetron (ZOFRAN ODT) 4 MG disintegrating tablet  ODT q4 hours prn nausea/vomit  03/16/15   Mirian Mo, MD  oxycodone-acetaminophen (PERCOCET) 2.5-325 MG per tablet Take 1 tablet by mouth every 4 (four) hours as needed for pain.    Historical Provider, MD   BP 130/85 mmHg  Pulse 73  Temp(Src) 97.9 F (36.6 C) (Oral)  Resp 18  Ht  (1.702 m)  Wt 180 lb (81.647 kg)  BMI 28.19 kg/m2  SpO2 99% Physical Exam  Constitutional: He is oriented to person, place, and time. He appears well-nourished.  HENT:  Head: Normocephalic.  Mouth/Throat: Oropharynx is clear and moist.  Eyes: Conjunctivae are normal.  Neck: No tracheal deviation present.  Cardiovascular: Normal rate.   Pulmonary/Chest: Effort normal. No stridor. No respiratory distress.  Abdominal: Soft. There is no tenderness. There is no guarding.  Musculoskeletal: Normal range of motion. He exhibits no edema.  Neurological: He is oriented to person, place, and time. No cranial nerve deficit.  Skin: Skin is warm and dry. No rash noted. He is not diaphoretic.  Psychiatric: He has a normal mood and affect. His behavior is normal.  Nursing note and vitals reviewed.   ED Course  Procedures (including critical care time) Labs Review Labs Reviewed - No data to display  Imaging Review No results found. I have personally reviewed and evaluated these images and lab results as part of my medical decision-making.   EKG Interpretation None      MDM   Final diagnoses:  None    Patient is a pleasant 34 year old male presents  with migraine. Patient has history of migraine this feels his normal or migraine. Do not suspect subarachnoid hemorrhage or other cause of bleeding.  We'll give migraine cocktail plan to have him follow-up with primary care physician.    Courteney Randall AnLyn Mackuen, MD 04/06/15 [id number]

## 2015-04-06 NOTE — ED Notes (Signed)
Headache since yesterday at 9am, took tylenol yesterday which helped but HA returned at 4am

## 2015-04-06 NOTE — Discharge Instructions (Signed)

## 2015-04-23 ENCOUNTER — Emergency Department (HOSPITAL_BASED_OUTPATIENT_CLINIC_OR_DEPARTMENT_OTHER): Payer: Medicare Other

## 2015-04-23 ENCOUNTER — Emergency Department (HOSPITAL_BASED_OUTPATIENT_CLINIC_OR_DEPARTMENT_OTHER)
Admission: EM | Admit: 2015-04-23 | Discharge: 2015-04-23 | Disposition: A | Discharge status: Home / Self Care | Payer: Medicare Other | Attending: Emergency Medicine | Admitting: Emergency Medicine

## 2015-04-23 ENCOUNTER — Encounter (HOSPITAL_BASED_OUTPATIENT_CLINIC_OR_DEPARTMENT_OTHER): Payer: Self-pay | Admitting: Emergency Medicine

## 2015-04-23 DIAGNOSIS — R05 Cough: Secondary | ICD-10-CM | POA: Diagnosis present

## 2015-04-23 DIAGNOSIS — Z720 Tobacco use: Secondary | ICD-10-CM

## 2015-04-23 DIAGNOSIS — Z792 Long term (current) use of antibiotics: Secondary | ICD-10-CM | POA: Insufficient documentation

## 2015-04-23 DIAGNOSIS — Z8659 Personal history of other mental and behavioral disorders: Secondary | ICD-10-CM | POA: Insufficient documentation

## 2015-04-23 DIAGNOSIS — R059 Cough, unspecified: Secondary | ICD-10-CM

## 2015-04-23 DIAGNOSIS — R112 Nausea with vomiting, unspecified: Secondary | ICD-10-CM | POA: Diagnosis not present

## 2015-04-23 DIAGNOSIS — Z880 Allergy status to penicillin: Secondary | ICD-10-CM | POA: Insufficient documentation

## 2015-04-23 DIAGNOSIS — J069 Acute upper respiratory infection, unspecified: Secondary | ICD-10-CM | POA: Diagnosis not present

## 2015-04-23 DIAGNOSIS — J3489 Other specified disorders of nose and nasal sinuses: Secondary | ICD-10-CM

## 2015-04-23 MED ORDER — ONDANSETRON 8 MG PO TBDP
8.0000 mg | ORAL_TABLET | Freq: Once | ORAL | Status: AC
  Administered 2015-04-23: 8 mg via ORAL
  Filled 2015-04-23: qty 1

## 2015-04-23 MED ORDER — ONDANSETRON 4 MG PO TBDP: 4.0000 mg | ORAL_TABLET | Freq: Three times a day (TID) | ORAL | Status: DC | PRN

## 2015-04-23 NOTE — ED Notes (Signed)
Pt verbalizes understanding of d/c instructions and denies any further needs at this time. 

## 2015-04-23 NOTE — ED Notes (Signed)
Pt drinking soda without issue

## 2015-04-23 NOTE — ED Notes (Signed)
Patient states that he has had a cough and chest burning with the cough since yesterday. The patient reports that he threw up 2 -3 times today only after he ate.

## 2015-04-23 NOTE — Discharge Instructions (Signed)
Continue to stay well-hydrated, use zofran as directed as needed for nausea. Continue to alternate between Tylenol and Ibuprofen for pain or fever. Use Mucinex for cough suppression/expectoration of mucus. Use netipot and flonase to help with nasal congestion. May consider over-the-counter Benadryl or other antihistamine to decrease secretions and for watery itchy eyes. Followup with your primary care doctor listed on your medicaid card in 5-7 days for recheck of ongoing symptoms. Return to emergency department for emergent changing or worsening of symptoms.   Cough, Adult A cough helps to clear your throat and lungs. A cough may last only 2-3 weeks (acute), or it may last longer than 8 weeks (chronic). Many different things can cause a cough. A cough may be a sign of an illness or another medical condition. HOME CARE  Pay attention to any changes in your cough.  Take medicines only as told by your doctor.  If you were prescribed an antibiotic medicine, take it as told by your doctor. Do not stop taking it even if you start to feel better.  Talk with your doctor before you try using a cough medicine.  Drink enough fluid to keep your pee (urine) clear or pale yellow.  If the air is dry, use a cold steam vaporizer or humidifier in your home.  Stay away from things that make you cough at work or at home.  If your cough is worse at night, try using extra pillows to raise your head up higher while you sleep.  Do not smoke, and try not to be around smoke. If you need help quitting, ask your doctor.  Do not have caffeine.  Do not drink alcohol.  Rest as needed. GET HELP IF:  You have new problems (symptoms).  You cough up yellow fluid (pus).  Your cough does not get better after 2-3 weeks, or your cough gets worse.  Medicine does not help your cough and you are not sleeping well.  You have pain that gets worse or pain that is not helped with medicine.  You have a fever.  You are  losing weight and you do not know why.  You have night sweats. GET HELP RIGHT AWAY IF:  You cough up blood.  You have trouble breathing.  Your heartbeat is very fast.   This information is not intended to replace advice given to you by your health care provider. Make sure you discuss any questions you have with your health care provider.   Document Released: 02/19/2011 Document Revised: 02/27/2015 Document Reviewed: 08/15/2014 Elsevier Interactive Patient Education 2016 [employer]  Nausea and Vomiting Nausea is a sick feeling that often comes before throwing up (vomiting). Vomiting is a reflex where stomach contents come out of your mouth. Vomiting can cause severe loss of body fluids (dehydration). Children and elderly adults can become dehydrated quickly, especially if they also have diarrhea. Nausea and vomiting are symptoms of a condition or disease. It is important to find the cause of your symptoms. CAUSES   Direct irritation of the stomach lining. This irritation can result from increased acid production (gastroesophageal reflux disease), infection, food poisoning, taking certain medicines (such as nonsteroidal anti-inflammatory drugs), alcohol use, or tobacco use.  Signals from the brain.These signals could be caused by a headache, heat exposure, an inner ear disturbance, increased pressure in the brain from injury, infection, a tumor, or a concussion, pain, emotional stimulus, or metabolic problems.  An obstruction in the gastrointestinal tract (bowel obstruction).  Illnesses such as diabetes, hepatitis,  gallbladder problems, appendicitis, kidney problems, cancer, sepsis, atypical symptoms of a heart attack, or eating disorders.  Medical treatments such as chemotherapy and radiation.  Receiving medicine that makes you sleep (general anesthetic) during surgery. DIAGNOSIS Your caregiver may ask for tests to be done if the problems do not improve after a few days. Tests may  also be done if symptoms are severe or if the reason for the nausea and vomiting is not clear. Tests may include:  Urine tests.  Blood tests.  Stool tests.  Cultures (to look for evidence of infection).  X-rays or other imaging studies. Test results can help your caregiver make decisions about treatment or the need for additional tests. TREATMENT You need to stay well hydrated. Drink frequently but in small amounts.You may wish to drink water, sports drinks, clear broth, or eat frozen ice pops or gelatin dessert to help stay hydrated.When you eat, eating slowly may help prevent nausea.There are also some antinausea medicines that may help prevent nausea. HOME CARE INSTRUCTIONS   Take all medicine as directed by your caregiver.  If you do not have an appetite, do not force yourself to eat. However, you must continue to drink fluids.  If you have an appetite, eat a normal diet unless your caregiver tells you differently.  Eat a variety of complex carbohydrates (rice, wheat, potatoes, bread), lean meats, yogurt, fruits, and vegetables.  Avoid high-fat foods because they are more difficult to digest.  Drink enough water and fluids to keep your urine clear or pale yellow.  If you are dehydrated, ask your caregiver for specific rehydration instructions. Signs of dehydration may include:  Severe thirst.  Dry lips and mouth.  Dizziness.  Dark urine.  Decreasing urine frequency and amount.  Confusion.  Rapid breathing or pulse. SEEK IMMEDIATE MEDICAL CARE IF:   You have blood or brown flecks (like coffee grounds) in your vomit.  You have black or bloody stools.  You have a severe headache or stiff neck.  You are confused.  You have severe abdominal pain.  You have chest pain or trouble breathing.  You do not urinate at least once every 8 hours.  You develop cold or clammy skin.  You continue to vomit for longer than 24 to 48 hours.  You have a fever. MAKE  SURE YOU:   Understand these instructions.  Will watch your condition.  Will get help right away if you are not doing well or get worse.   This information is not intended to replace advice given to you by your health care provider. Make sure you discuss any questions you have with your health care provider.   Document Released: 06/08/2005 Document Revised: 08/31/2011 Document Reviewed: 11/05/2010 Elsevier Interactive Patient Education 2016 [employer]  Upper Respiratory Infection, Adult Most upper respiratory infections (URIs) are a viral infection of the air passages leading to the lungs. A URI affects the nose, throat, and upper air passages. The most common type of URI is nasopharyngitis and is typically referred to as "the common cold." URIs run their course and usually go away on their own. Most of the time, a URI does not require medical attention, but sometimes a bacterial infection in the upper airways can follow a viral infection. This is called a secondary infection. Sinus and middle ear infections are common types of secondary upper respiratory infections. Bacterial pneumonia can also complicate a URI. A URI can worsen asthma and chronic obstructive pulmonary disease (COPD). Sometimes, these complications can require emergency  medical care and may be life threatening.  CAUSES Almost all URIs are caused by viruses. A virus is a type of germ and can spread from one person to another.  RISKS FACTORS You may be at risk for a URI if:   You smoke.   You have chronic heart or lung disease.  You have a weakened defense (immune) system.   You are very young or very old.   You have nasal allergies or asthma.  You work in crowded or poorly ventilated areas.  You work in health care facilities or schools. SIGNS AND SYMPTOMS  Symptoms typically develop 2-3 days after you come in contact with a cold virus. Most viral URIs last 7-10 days. However, viral URIs from the influenza  virus (flu virus) can last 14-18 days and are typically more severe. Symptoms may include:   Runny or stuffy (congested) nose.   Sneezing.   Cough.   Sore throat.   Headache.   Fatigue.   Fever.   Loss of appetite.   Pain in your forehead, behind your eyes, and over your cheekbones (sinus pain).  Muscle aches.  DIAGNOSIS  Your health care provider may diagnose a URI by:  Physical exam.  Tests to check that your symptoms are not due to another condition such as:  Strep throat.  Sinusitis.  Pneumonia.  Asthma. TREATMENT  A URI goes away on its own with time. It cannot be cured with medicines, but medicines may be prescribed or recommended to relieve symptoms. Medicines may help:  Reduce your fever.  Reduce your cough.  Relieve nasal congestion. HOME CARE INSTRUCTIONS   Take medicines only as directed by your health care provider.   Gargle warm saltwater or take cough drops to comfort your throat as directed by your health care provider.  Use a warm mist humidifier or inhale steam from a shower to increase air moisture. This may make it easier to breathe.  Drink enough fluid to keep your urine clear or pale yellow.   Eat soups and other clear broths and maintain good nutrition.   Rest as needed.   Return to work when your temperature has returned to normal or as your health care provider advises. You may need to stay home longer to avoid infecting others. You can also use a face mask and careful hand washing to prevent spread of the virus.  Increase the usage of your inhaler if you have asthma.   Do not use any tobacco products, including cigarettes, chewing tobacco, or electronic cigarettes. If you need help quitting, ask your health care provider. PREVENTION  The best way to protect yourself from getting a cold is to practice good hygiene.   Avoid oral or hand contact with people with cold symptoms.   Wash your hands often if contact  occurs.  There is no clear evidence that vitamin C, vitamin E, echinacea, or exercise reduces the chance of developing a cold. However, it is always recommended to get plenty of rest, exercise, and practice good nutrition.  SEEK MEDICAL CARE IF:   You are getting worse rather than better.   Your symptoms are not controlled by medicine.   You have chills.  You have worsening shortness of breath.  You have brown or red mucus.  You have yellow or brown nasal discharge.  You have pain in your face, especially when you bend forward.  You have a fever.  You have swollen neck glands.  You have pain while swallowing.  You have white areas in the back of your throat. SEEK IMMEDIATE MEDICAL CARE IF:   You have severe or persistent:  Headache.  Ear pain.  Sinus pain.  Chest pain.  You have chronic lung disease and any of the following:  Wheezing.  Prolonged cough.  Coughing up blood.  A change in your usual mucus.  You have a stiff neck.  You have changes in your:  Vision.  Hearing.  Thinking.  Mood. MAKE SURE YOU:   Understand these instructions.  Will watch your condition.  Will get help right away if you are not doing well or get worse.   This information is not intended to replace advice given to you by your health care provider. Make sure you discuss any questions you have with your health care provider.   Document Released: 12/02/2000 Document Revised: 10/23/2014 Document Reviewed: 09/13/2013 Elsevier Interactive Patient Education 2016 [employer].  Smoking Cessation, Tips for Success If you are ready to quit smoking, congratulations! You have chosen to help yourself be healthier. Cigarettes bring nicotine, tar, carbon monoxide, and other irritants into your body. Your lungs, heart, and blood vessels will be able to work better without these poisons. There are many different ways to quit smoking. Nicotine gum, nicotine patches, a nicotine  inhaler, or nicotine nasal spray can help with physical craving. Hypnosis, support groups, and medicines help break the habit of smoking. WHAT THINGS CAN I DO TO MAKE QUITTING EASIER?  Here are some tips to help you quit for good:  Pick a date when you will quit smoking completely. Tell all of your friends and family about your plan to quit on that date.  Do not try to slowly cut down on the number of cigarettes you are smoking. Pick a quit date and quit smoking completely starting on that day.  Throw away all cigarettes.   Clean and remove all ashtrays from your home, work, and car.  On a card, write down your reasons for quitting. Carry the card with you and read it when you get the urge to smoke.  Cleanse your body of nicotine. Drink enough water and fluids to keep your urine clear or pale yellow. Do this after quitting to flush the nicotine from your body.  Learn to predict your moods. Do not let a bad situation be your excuse to have a cigarette. Some situations in your life might tempt you into wanting a cigarette.  Never have "just one" cigarette. It leads to wanting another and another. Remind yourself of your decision to quit.  Change habits associated with smoking. If you smoked while driving or when feeling stressed, try other activities to replace smoking. Stand up when drinking your coffee. Brush your teeth after eating. Sit in a different chair when you read the paper. Avoid alcohol while trying to quit, and try to drink fewer caffeinated beverages. Alcohol and caffeine may urge you to smoke.  Avoid foods and drinks that can trigger a desire to smoke, such as sugary or spicy foods and alcohol.  Ask people who smoke not to smoke around you.  Have something planned to do right after eating or having a cup of coffee. For example, plan to take a walk or exercise.  Try a relaxation exercise to calm you down and decrease your stress. Remember, you may be tense and nervous for the  first 2 weeks after you quit, but this will pass.  Find new activities to keep your hands busy. Play with  a pen, coin, or rubber band. Doodle or draw things on paper.  Brush your teeth right after eating. This will help cut down on the craving for the taste of tobacco after meals. You can also try mouthwash.   Use oral substitutes in place of cigarettes. Try using lemon drops, carrots, cinnamon sticks, or chewing gum. Keep them handy so they are available when you have the urge to smoke.  When you have the urge to smoke, try deep breathing.  Designate your home as a nonsmoking area.  If you are a heavy smoker, ask your health care provider about a prescription for nicotine chewing gum. It can ease your withdrawal from nicotine.  Reward yourself. Set aside the cigarette money you save and buy yourself something nice.  Look for support from others. Join a support group or smoking cessation program. Ask someone at home or at work to help you with your plan to quit smoking.  Always ask yourself, "Do I need this cigarette or is this just a reflex?" Tell yourself, "Today, I choose not to smoke," or "I do not want to smoke." You are reminding yourself of your decision to quit.  Do not replace cigarette smoking with electronic cigarettes (commonly called e-cigarettes). The safety of e-cigarettes is unknown, and some may contain harmful chemicals.  If you relapse, do not give up! Plan ahead and think about what you will do the next time you get the urge to smoke. HOW WILL I FEEL WHEN I QUIT SMOKING? You may have symptoms of withdrawal because your body is used to nicotine (the addictive substance in cigarettes). You may crave cigarettes, be irritable, feel very hungry, cough often, get headaches, or have difficulty concentrating. The withdrawal symptoms are only temporary. They are strongest when you first quit but will go away within 10-14 days. When withdrawal symptoms occur, stay in control. Think  about your reasons for quitting. Remind yourself that these are signs that your body is healing and getting used to being without cigarettes. Remember that withdrawal symptoms are easier to treat than the major diseases that smoking can cause.  Even after the withdrawal is over, expect periodic urges to smoke. However, these cravings are generally short lived and will go away whether you smoke or not. Do not smoke! WHAT RESOURCES ARE AVAILABLE TO HELP ME QUIT SMOKING? Your health care provider can direct you to community resources or hospitals for support, which may include:  Group support.  Education.  Hypnosis.  Therapy.   This information is not intended to replace advice given to you by your health care provider. Make sure you discuss any questions you have with your health care provider.   Document Released: 03/06/2004 Document Revised: 06/29/2014 Document Reviewed: 11/24/2012 Elsevier Interactive Patient Education [employer]

## 2015-04-23 NOTE — ED Provider Notes (Signed)
CSN: [id number]     Arrival date & time 04/23/15  1911 History   First MD Initiated Contact with Patient 04/23/15 2055     Chief Complaint  Patient presents with  . Cough     (Consider location/radiation/quality/duration/timing/severity/associated sxs/prior Treatment) HPI Comments: Jeffrey Crawford is a 34 y.o. male with a PMHx of PTSD, who presents to the ED with complaints of cough with yellow sputum production 1 day associated with green rhinorrhea, wheezing, nausea, and 4 episodes of nonbloody nonbilious emesis. Patient is a current smoker. He reports some mild chest pain only with cough, currently resolved. He got some over-the-counter cold and flu medication which has helped some of the symptoms, no aggravating factors. He denies any fevers, chills, eye pain or drainage, ear pain or drainage, sore throat, chest pain at rest, shortness of breath, abdominal pain, hematemesis, diarrhea, constipation, melena, hematochezia, dysuria, hematuria, numbness, tingling, weakness, or recent travel. Positive sick contacts at home.  Patient is a 34 y.o. male presenting with cough. The history is provided by the patient. No language interpreter was used.  Cough Cough characteristics:  Productive Sputum characteristics:  Green Severity:  Moderate Onset quality:  Gradual Duration:  1 day Timing:  Constant Progression:  Unchanged Chronicity:  New Smoker: yes   Context: upper respiratory infection   Relieved by: OTC meds. Worsened by:  Nothing tried Ineffective treatments:  None tried Associated symptoms: chest pain (only with cough), rhinorrhea and wheezing   Associated symptoms: no chills, no ear fullness, no ear pain, no eye discharge, no fever, no myalgias, no shortness of breath and no sore throat   Risk factors: no recent infection and no recent travel     Past Medical History  Diagnosis Date  . PTSD (post-traumatic stress disorder)    Past Surgical History  Procedure Laterality Date    . Knee surgery Bilateral   . Middle ear surgery     History reviewed. No pertinent family history. Social History  Substance Use Topics  . Smoking status: Current Some Day Smoker -- 0.50 packs/day    Types: Cigarettes  . Smokeless tobacco: None  . Alcohol Use: No    Review of Systems  Constitutional: Negative for fever and chills.  HENT: Positive for rhinorrhea. Negative for ear discharge, ear pain, sore throat and trouble swallowing.   Eyes: Negative for discharge.  Respiratory: Positive for cough and wheezing. Negative for shortness of breath.   Cardiovascular: Positive for chest pain (only with cough).  Gastrointestinal: Positive for nausea and vomiting. Negative for abdominal pain, diarrhea, constipation and blood in stool.  Genitourinary: Negative for dysuria and hematuria.  Musculoskeletal: Negative for myalgias and arthralgias.  Skin: Negative for color change.  Allergic/Immunologic: Negative for immunocompromised state.  Neurological: Negative for weakness and numbness.  Psychiatric/Behavioral: Negative for confusion.   10 Systems reviewed and are negative for acute change except as noted in the HPI.    Allergies  Amoxicillin and Penicillins  Home Medications   Prior to Admission medications   Medication Sig Start Date End Date Taking? Authorizing Provider  clindamycin (CLEOCIN) 150 MG capsule Take 1 capsule (150 mg total) by mouth 4 (four) times daily. May dispense as 150mg  capsules 01/19/15   Dione Boozeavid Glick, MD  diphenhydrAMINE (SOMINEX) 25 MG tablet Take 25 mg by mouth at bedtime as needed for sleep.    Historical Provider, MD  ondansetron (ZOFRAN ODT) 4 MG disintegrating tablet 4mg  ODT q4 hours prn nausea/vomit 03/16/15   Mirian MoMatthew Gentry, MD  oxycodone-acetaminophen [facility](PERCOCET)  2.5-325 MG per tablet Take 1 tablet by mouth every 4 (four) hours as needed for pain.    Historical Provider, MD  oxyCODONE-acetaminophen (PERCOCET) 7.5-325 MG tablet Take 1 tablet by mouth every  8 (eight) hours as needed for severe pain.    Historical Provider, MD   BP 131/85 mmHg  Pulse 85  Temp(Src) 98.5 F (36.9 C) (Oral)  Resp 16  Ht  (1.676 m)  Wt 182 lb (82.555 kg)  BMI 29.39 kg/m2  SpO2 100% Physical Exam  Constitutional: He is oriented to person, place, and time. Vital signs are normal. He appears well-developed and well-nourished.  Non-toxic appearance. No distress.  Afebrile, nontoxic, NAD  HENT:  Head: Normocephalic and atraumatic.  Nose: Mucosal edema and rhinorrhea present.  Mouth/Throat: Uvula is midline, oropharynx is clear and moist and mucous membranes are normal. No trismus in the jaw. No uvula swelling.  Nose with mild mucosal edema and rhinorrhea. Oropharynx clear and moist, without uvular swelling or deviation, no trismus or drooling, no tonsillar swelling or erythema, no exudates.    Eyes: Conjunctivae and EOM are normal. Right eye exhibits no discharge. Left eye exhibits no discharge.  Neck: Normal range of motion. Neck supple.  Cardiovascular: Normal rate, regular rhythm, normal heart sounds and intact distal pulses.  Exam reveals no gallop and no friction rub.   No murmur heard. Pulmonary/Chest: Effort normal and breath sounds normal. No respiratory distress. He has no decreased breath sounds. He has no wheezes. He has no rhonchi. He has no rales. He exhibits tenderness. He exhibits no crepitus, no deformity and no retraction.    CTAB in all lung fields, no w/r/r, no hypoxia or increased WOB, speaking in full sentences, SpO2 100% on RA  Mild chest wall TTP, no crepitus or retractions, no deformities  Abdominal: Soft. Normal appearance and bowel sounds are normal. He exhibits no distension. There is no tenderness. There is no rigidity, no rebound, no guarding, no CVA tenderness, no tenderness at McBurney's point and negative Murphy's sign.  Soft, NTND, +BS throughout, no r/g/r, neg murphy's, neg mcburney's, no CVA TTP   Musculoskeletal: Normal range  of motion.  Lymphadenopathy:    He has cervical adenopathy.  Shotty cervical LAD bilaterally which is mildly TTP  Neurological: He is alert and oriented to person, place, and time. He has normal strength. No sensory deficit.  Skin: Skin is warm, dry and intact. No rash noted.  Psychiatric: He has a normal mood and affect.  Nursing note and vitals reviewed.   ED Course  Procedures (including critical care time) Labs Review Labs Reviewed - No data to display  Imaging Review Dg Chest 2 View  04/23/2015  CLINICAL DATA:  Cough and burning sensation in chest EXAM: CHEST  2 VIEW COMPARISON:  None. FINDINGS: Lungs are clear. The heart size and pulmonary vascularity are normal. No adenopathy. No pneumothorax. No bone lesions. IMPRESSION: No abnormality noted. Electronically Signed   By: Bretta Bang III M.D.   On: 04/23/2015 19:49   I have personally reviewed and evaluated these images and lab results as part of my medical decision-making.   EKG Interpretation None      MDM   Final diagnoses:  URI (upper respiratory infection)  Non-intractable vomiting with nausea, vomiting of unspecified type  Tobacco use  Rhinorrhea    34 y.o. male here with URI symptoms, cough, and n/v x4 today. No abd tenderness. Pt is afebrile with a clear lung exam. Mild rhinorrhea. Likely viral  URI. Will give zofran and PO challenge, then reassess.   9:35 PM Tolerating PO well. Nausea improved. Will send home with zofran, Pt is agreeable to symptomatic treatment with close follow up with PCP as needed but spoke at length about emergent changing or worsening of symptoms that should prompt return to ER. Pt voices understanding and is agreeable to plan. Stable at time of discharge.    BP 119/80 mmHg  Pulse 70  Temp(Src) 98.1 F (36.7 C) (Oral)  Resp 16  Ht  (1.676 m)  Wt 182 lb (82.555 kg)  BMI 29.39 kg/m2  SpO2 100%  Meds ordered this encounter  Medications  . ondansetron (ZOFRAN-ODT)  disintegrating tablet 8 mg    Sig:   . ondansetron (ZOFRAN ODT) 4 MG disintegrating tablet    Sig: Take 1 tablet (4 mg total) by mouth every 8 (eight) hours as needed for nausea or vomiting.    Dispense:  15 tablet    Refill:  0    Order Specific Question:  Supervising Provider    Answer:  Eber Hong [[id number]]      Oviya Ammar Camprubi-Soms, PA-C 04/23/15 2135  Mirian Mo, MD 04/26/15 1013

## 2015-04-23 NOTE — ED Notes (Signed)
Pt tolerating PO fluids

## 2015-05-07 ENCOUNTER — Emergency Department (HOSPITAL_BASED_OUTPATIENT_CLINIC_OR_DEPARTMENT_OTHER)
Admission: EM | Admit: 2015-05-07 | Discharge: 2015-05-08 | Disposition: A | Discharge status: Home / Self Care | Payer: Medicare Other | Attending: Emergency Medicine | Admitting: Emergency Medicine

## 2015-05-07 ENCOUNTER — Encounter (HOSPITAL_BASED_OUTPATIENT_CLINIC_OR_DEPARTMENT_OTHER): Payer: Self-pay

## 2015-05-07 DIAGNOSIS — Z880 Allergy status to penicillin: Secondary | ICD-10-CM | POA: Diagnosis not present

## 2015-05-07 DIAGNOSIS — F17210 Nicotine dependence, cigarettes, uncomplicated: Secondary | ICD-10-CM | POA: Insufficient documentation

## 2015-05-07 DIAGNOSIS — Z87828 Personal history of other (healed) physical injury and trauma: Secondary | ICD-10-CM | POA: Diagnosis not present

## 2015-05-07 DIAGNOSIS — M79641 Pain in right hand: Secondary | ICD-10-CM | POA: Diagnosis present

## 2015-05-07 DIAGNOSIS — Z8659 Personal history of other mental and behavioral disorders: Secondary | ICD-10-CM | POA: Diagnosis not present

## 2015-05-07 NOTE — ED Notes (Signed)
Right hand pain x 2 days-denies injury-hx of gout

## 2015-05-08 DIAGNOSIS — M79641 Pain in right hand: Secondary | ICD-10-CM | POA: Diagnosis not present

## 2015-05-08 MED ORDER — KETOROLAC TROMETHAMINE 60 MG/2ML IM SOLN
60.0000 mg | Freq: Once | INTRAMUSCULAR | Status: AC
  Administered 2015-05-08: 60 mg via INTRAMUSCULAR

## 2015-05-08 MED ORDER — KETOROLAC TROMETHAMINE 60 MG/2ML IM SOLN
INTRAMUSCULAR | Status: AC
  Filled 2015-05-08: qty 2

## 2015-05-08 MED ORDER — NAPROXEN 500 MG PO TABS: 500.0000 mg | ORAL_TABLET | Freq: Two times a day (BID) | ORAL | Status: DC

## 2015-05-08 NOTE — Discharge Instructions (Signed)
You were seen today for hand pain. You have an old injury. You also have a history of gout. You are already on narcotic pain medications at home. While it is unclear whether this is your gout, it is reasonable to place you on an anti-inflammatory medication. Follow-up with her primary physician.

## 2015-05-08 NOTE — ED Provider Notes (Signed)
CSN: [id number]     Arrival date & time 05/07/15  2200 History   First MD Initiated Contact with Patient 05/08/15 0007     Chief Complaint  Patient presents with  . Hand Pain     (Consider location/radiation/quality/duration/timing/severity/associated sxs/prior Treatment) HPI  This a 34 year old male with a history of gout and right hand injury who presents with right hand pain. Patient reports 2 day history of worsening right hand pain. Denies any new injury but reports a "old injury that flares up at times." He reports pain over the extensor tendon of the thumb and along the web spacing between the first and second digits. He takes 7.5 mg of oxycodone at home. He states that this is not helping. Currently he rates his pain at 7 out of 10.  Pain is worsened with tight grip. He is not taking any anti-inflammatory medications. He has had similar symptoms in the past and was treated for gout. He states "the gout medication helped me."  Past Medical History  Diagnosis Date  . PTSD (post-traumatic stress disorder)    Past Surgical History  Procedure Laterality Date  . Knee surgery Bilateral   . Middle ear surgery     No family history on file. Social History  Substance Use Topics  . Smoking status: Current Some Day Smoker -- 0.50 packs/day    Types: Cigarettes  . Smokeless tobacco: None  . Alcohol Use: No    Review of Systems  Constitutional: Negative for fever.  Musculoskeletal: Positive for arthralgias.  Skin: Negative for color change and wound.  All other systems reviewed and are negative.     Allergies  Amoxicillin and Penicillins  Home Medications   Prior to Admission medications   Medication Sig Start Date End Date Taking? Authorizing Provider  naproxen (NAPROSYN) 500 MG tablet Take 1 tablet (500 mg total) by mouth 2 (two) times daily. 05/08/15   Shon Batonourtney F Cuba Natarajan, MD  oxyCODONE-acetaminophen (PERCOCET) 7.5-325 MG tablet Take 1 tablet by mouth every 8 (eight)  hours as needed for severe pain.    Historical Provider, MD   BP 114/81 mmHg  Pulse 70  Temp(Src) 98.1 F (36.7 C) (Oral)  Resp 16  Ht 5\' 7"  (1.702 m)  Wt 185 lb (83.915 kg)  BMI 28.97 kg/m2  SpO2 99% Physical Exam  Constitutional: He is oriented to person, place, and time. He appears well-developed and well-nourished. No distress.  HENT:  Head: Normocephalic and atraumatic.  Cardiovascular: Normal rate and regular rhythm.   Pulmonary/Chest: Effort normal. No respiratory distress.  Musculoskeletal: He exhibits no edema.  Tenderness to palpation in the web spacing of the first and second digits. Normal range of motion of the wrist, no snuffbox tenderness, no overlying skin changes, 2+ radial pulse  Neurological: He is alert and oriented to person, place, and time.  Skin: Skin is warm and dry.  Psychiatric: He has a normal mood and affect.  Nursing note and vitals reviewed.   ED Course  Procedures (including critical care time) Labs Review Labs Reviewed - No data to display  Imaging Review No results found. I have personally reviewed and evaluated these images and lab results as part of my medical decision-making.   EKG Interpretation None      MDM   Final diagnoses:  Pain of right hand    Patient presents with right hand pain. Similar symptoms in the past that have responded to "gout medication." No obvious injury on exam. The pain is within the  web spacing of the hand which is somewhat atypical. I would have a low suspicion for gout but if patient improves before with anti-inflammatory medications, feel it is reasonable to start patient on naproxen. He is a 30 taking narcotics at home.  After history, exam, and medical workup I feel the patient has been appropriately medically screened and is safe for discharge home. Pertinent diagnoses were discussed with the patient. Patient was given return precautions.     Shon Baton, MD 05/08/15 [phone]

## 2015-06-13 ENCOUNTER — Emergency Department (HOSPITAL_BASED_OUTPATIENT_CLINIC_OR_DEPARTMENT_OTHER)
Admission: EM | Admit: 2015-06-13 | Discharge: 2015-06-14 | Disposition: A | Discharge status: Home / Self Care | Payer: Medicare Other | Attending: Emergency Medicine | Admitting: Emergency Medicine

## 2015-06-13 DIAGNOSIS — G8929 Other chronic pain: Secondary | ICD-10-CM | POA: Diagnosis not present

## 2015-06-13 DIAGNOSIS — F17210 Nicotine dependence, cigarettes, uncomplicated: Secondary | ICD-10-CM | POA: Insufficient documentation

## 2015-06-13 DIAGNOSIS — M79641 Pain in right hand: Secondary | ICD-10-CM | POA: Diagnosis not present

## 2015-06-13 DIAGNOSIS — R21 Rash and other nonspecific skin eruption: Secondary | ICD-10-CM | POA: Diagnosis present

## 2015-06-13 DIAGNOSIS — Z880 Allergy status to penicillin: Secondary | ICD-10-CM | POA: Insufficient documentation

## 2015-06-13 DIAGNOSIS — Z8639 Personal history of other endocrine, nutritional and metabolic disease: Secondary | ICD-10-CM | POA: Insufficient documentation

## 2015-06-13 DIAGNOSIS — B029 Zoster without complications: Secondary | ICD-10-CM | POA: Diagnosis not present

## 2015-06-13 DIAGNOSIS — Z791 Long term (current) use of non-steroidal anti-inflammatories (NSAID): Secondary | ICD-10-CM | POA: Diagnosis not present

## 2015-06-13 HISTORY — DX: Other chronic pain: G89.29

## 2015-06-13 HISTORY — DX: Dorsalgia, unspecified: M54.9

## 2015-06-13 NOTE — ED Notes (Signed)
Pt c/o insect bites to right forehead since yesterday as well as chronic right hand pain that he was seen for a few months ago that hasn't gotten better.

## 2015-06-14 ENCOUNTER — Encounter (HOSPITAL_BASED_OUTPATIENT_CLINIC_OR_DEPARTMENT_OTHER): Payer: Self-pay

## 2015-06-14 DIAGNOSIS — B029 Zoster without complications: Secondary | ICD-10-CM | POA: Diagnosis not present

## 2015-06-14 MED ORDER — FLUORESCEIN SODIUM 1 MG OP STRP
1.0000 | ORAL_STRIP | Freq: Once | OPHTHALMIC | Status: AC
  Administered 2015-06-14: 1 via OPHTHALMIC

## 2015-06-14 MED ORDER — VALACYCLOVIR HCL 1 G PO TABS: 1000.0000 mg | ORAL_TABLET | Freq: Three times a day (TID) | ORAL | Status: AC

## 2015-06-14 MED ORDER — HYDROCODONE-ACETAMINOPHEN 5-325 MG PO TABS: 1.0000 | ORAL_TABLET | Freq: Four times a day (QID) | ORAL | Status: DC | PRN

## 2015-06-14 MED ORDER — FLUORESCEIN SODIUM 1 MG OP STRP
ORAL_STRIP | OPHTHALMIC | Status: AC
  Filled 2015-06-14: qty 1

## 2015-06-14 MED ORDER — VALACYCLOVIR HCL 500 MG PO TABS
500.0000 mg | ORAL_TABLET | Freq: Once | ORAL | Status: AC
  Administered 2015-06-14: 500 mg via ORAL
  Filled 2015-06-14: qty 1

## 2015-06-14 NOTE — ED Notes (Signed)
MD at bedside. 

## 2015-06-14 NOTE — ED Provider Notes (Addendum)
CSN: [id number]     Arrival date & time 06/13/15  2353 History   First MD Initiated Contact with Patient 06/14/15 0016     Chief Complaint  Patient presents with  . Insect Bite     (Consider location/radiation/quality/duration/timing/severity/associated sxs/prior Treatment) HPI  This is a 34 year old male who developed a rash on his right for head and right cheek yesterday. The rash is papular, pruritic and painful. Pain is moderate to severe, worse with eating or palpation. He denies any blurred vision or eye pain. He denies fever or chills. 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   Past Medical History  Diagnosis Date  . PTSD (post-traumatic stress disorder)   . Chronic back pain    Past Surgical History  Procedure Laterality Date  . Knee surgery Bilateral   . Middle ear surgery     No family history on file. Social History  Substance Use Topics  . Smoking status: Current Some Day Smoker -- 0.50 packs/day    Types: Cigarettes  . Smokeless tobacco: None  . Alcohol Use: No    Review of Systems  All other systems reviewed and are negative.   Allergies  Amoxicillin and Penicillins  Home Medications   Prior to Admission medications   Medication Sig Start Date End Date Taking? Authorizing Provider  oxyCODONE-acetaminophen (PERCOCET) 7.5-325 MG tablet Take 1 tablet by mouth every 8 (eight) hours as needed for severe pain.   Yes Historical Provider, MD  naproxen (NAPROSYN) 500 MG tablet Take 1 tablet (500 mg total) by mouth 2 (two) times daily. 05/08/15   Shon Batonourtney F Horton, MD   BP 117/88 mmHg  Pulse 82  Temp(Src) 98.2 F (36.8 C) (Oral)  Resp 18  Ht 5\' 7"  (1.702 m)  Wt 175 lb (79.379 kg)  BMI 27.40 kg/m2  SpO2 99%   Physical Exam  General: Well-developed, well-nourished male in no acute distress; appearance consistent with age of record HENT: normocephalic; atraumatic; tender, papular rash of the right for hidden right preauricular area with  tender preauricular lymph node, no lesions seen in external auditory canal Eyes: pupils equal, round and reactive to light; extraocular muscles intact; no fluorescein uptake or dendritic pattern seen on right cornea Neck: supple Heart: regular rate and rhythm Lungs: clear to auscultation bilaterally Abdomen: soft; nondistended; nontender Extremities: No deformity; full range of motion; tenderness of right thenar web without erythema or warmth Neurologic: Awake, alert and oriented; motor function intact in all extremities and symmetric; no facial droop Skin: Warm and dry Psychiatric: Normal mood and affect    ED Course  Procedures (including critical care time)   MDM  Rashes consistent with early shingles. Will start on Valtrex. He will follow-up with his primary care physician Dr. Ronne BinningMcKenzie regarding his hand.     Paula LibraJohn Madilyne Tadlock, MD 06/14/15 [phone]  Paula LibraJohn Ugonna Keirsey, MD 06/14/15 [phone]

## 2015-07-11 ENCOUNTER — Encounter (HOSPITAL_BASED_OUTPATIENT_CLINIC_OR_DEPARTMENT_OTHER): Payer: Self-pay | Admitting: *Deleted

## 2015-07-11 ENCOUNTER — Emergency Department (HOSPITAL_BASED_OUTPATIENT_CLINIC_OR_DEPARTMENT_OTHER)
Admission: EM | Admit: 2015-07-11 | Discharge: 2015-07-11 | Disposition: A | Discharge status: Home / Self Care | Payer: Medicare Other | Attending: Emergency Medicine | Admitting: Emergency Medicine

## 2015-07-11 DIAGNOSIS — F17210 Nicotine dependence, cigarettes, uncomplicated: Secondary | ICD-10-CM | POA: Diagnosis not present

## 2015-07-11 DIAGNOSIS — G8929 Other chronic pain: Secondary | ICD-10-CM | POA: Diagnosis not present

## 2015-07-11 DIAGNOSIS — Z880 Allergy status to penicillin: Secondary | ICD-10-CM | POA: Diagnosis not present

## 2015-07-11 DIAGNOSIS — M79641 Pain in right hand: Secondary | ICD-10-CM | POA: Insufficient documentation

## 2015-07-11 DIAGNOSIS — Z8659 Personal history of other mental and behavioral disorders: Secondary | ICD-10-CM | POA: Diagnosis not present

## 2015-07-11 HISTORY — DX: Gout, unspecified: M10.9

## 2015-07-11 MED ORDER — MELOXICAM 7.5 MG PO TABS: 7.5000 mg | ORAL_TABLET | Freq: Every day | ORAL | Status: DC

## 2015-07-11 NOTE — ED Notes (Signed)
Patient states he has chronic pain in his right hand.  States the swelling started last night and today the pain is severe.

## 2015-07-11 NOTE — ED Provider Notes (Signed)
CSN: [id number]     Arrival date & time 07/11/15  1709 History   First MD Initiated Contact with Patient 07/11/15 1733     Chief Complaint  Patient presents with  . Hand Pain     (Consider location/radiation/quality/duration/timing/severity/associated sxs/prior Treatment) HPI   35 year old male with history of chronic back pain currently narcotic history of gout and PTSD presents for evaluation of right hand pain. Patient report acute onset of pain to his right hand most significant to the first MCP and the webspace between the first and second finger. Pain is sharp throbbing worsening with palpation or with movement. Pain feels similar to his prior gout flare. Taking his Percocet at home has not helped the pain. He denies any fever, chills, numbness or weakness or rash. He denies any specific injury. 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  Past Medical History  Diagnosis Date  . PTSD (post-traumatic stress disorder)   . Chronic back pain   . Gout    Past Surgical History  Procedure Laterality Date  . Knee surgery Bilateral   . Middle ear surgery     No family history on file. Social History  Substance Use Topics  . Smoking status: Current Some Day Smoker -- 0.50 packs/day    Types: Cigarettes  . Smokeless tobacco: None  . Alcohol Use: No    Review of Systems  Constitutional: Negative for fever.  Skin: Negative for rash and wound.      Allergies  Amoxicillin and Penicillins  Home Medications   Prior to Admission medications   Medication Sig Start Date End Date Taking? Authorizing Provider  HYDROcodone-acetaminophen (NORCO/VICODIN) 5-325 MG tablet Take 1-2 tablets by mouth every 6 (six) hours as needed (for pain). 06/14/15  Yes John Molpus, MD  oxyCODONE-acetaminophen (PERCOCET) 7.5-325 MG tablet Take 1 tablet by mouth every 4 (four) hours as needed for severe pain.   Yes Historical Provider, MD   BP 125/81 mmHg  Pulse 63  Temp(Src) 98.1 F (36.7 C)  (Oral)  Resp 20  Ht  (1.702 m)  Wt 81.647 kg  BMI 28.19 kg/m2  SpO2 100% Physical Exam  Constitutional: He appears well-developed and well-nourished. No distress.  HENT:  Head: Atraumatic.  Eyes: Conjunctivae are normal.  Neck: Neck supple.  Musculoskeletal: He exhibits tenderness (Right hand: Tenderness to first MCP on palpation without any swelling, deformity, or overlying skin changes. Full range of motion throughout all joints with brisk cap refills and normal sensation distally. Radial pulse 2+. Right wrist nontender.).  Neurological: He is alert.  Skin: No rash noted.  Psychiatric: He has a normal mood and affect.  Nursing note and vitals reviewed.   ED Course  Procedures (including critical care time)   MDM   Final diagnoses:  Right hand pain    BP 125/81 mmHg  Pulse 63  Temp(Src) 98.1 F (36.7 C) (Oral)  Resp 20  Ht  (1.702 m)  Wt 81.647 kg  BMI 28.19 kg/m2  SpO2 100%   Patient presents with pain to his right hand near the first MCP. History of gout in the past. Pain felt similar to prior gouty flare. No signs of infection noted low suspicion for septic arthritis. Patient reported Mobic has helped in the past therefore I will prescribe mobic as treatment. Hand referral given as needed. Return precautions discussed.  Fayrene Helper, PA-C 07/11/15 2152  Leta Baptist, MD 07/12/15 [phone]

## 2015-07-11 NOTE — Discharge Instructions (Signed)

## 2015-07-11 NOTE — ED Notes (Addendum)
Pt sts he was given Mobic for the gout in his feet and also for this hand pain and he sts it helped.  He is out of it now. Pt sts he has Percocet and it is not helping with the pain.

## 2015-07-28 ENCOUNTER — Emergency Department (HOSPITAL_BASED_OUTPATIENT_CLINIC_OR_DEPARTMENT_OTHER)
Admission: EM | Admit: 2015-07-28 | Discharge: 2015-07-28 | Discharge status: Against Medical Advice | Payer: Medicare Other | Attending: Emergency Medicine | Admitting: Emergency Medicine

## 2015-07-28 ENCOUNTER — Encounter (HOSPITAL_BASED_OUTPATIENT_CLINIC_OR_DEPARTMENT_OTHER): Payer: Self-pay | Admitting: *Deleted

## 2015-07-28 DIAGNOSIS — F17210 Nicotine dependence, cigarettes, uncomplicated: Secondary | ICD-10-CM | POA: Insufficient documentation

## 2015-07-28 DIAGNOSIS — M79641 Pain in right hand: Secondary | ICD-10-CM | POA: Diagnosis present

## 2015-07-28 DIAGNOSIS — G8929 Other chronic pain: Secondary | ICD-10-CM | POA: Diagnosis not present

## 2015-07-28 NOTE — ED Notes (Signed)
In room to assess patient - pt cursing at staff, aggressive, verbally abusive - upset that EDP has not seen him, pt states "my fucking hand hurts - i need some Percocets 7.5's. Give me a work note, I'm leaving." - Informed patient that EDP was on her way to see him, pt very upset, states "im leaving", pt walked out of ED.

## 2015-07-28 NOTE — ED Notes (Signed)
Pt reports right hand pain that continues.  Pt has been seen and treated multiple times for same.

## 2015-07-28 NOTE — ED Notes (Signed)
Pt states that he was given vicodin last time he was here but that the pharmacy wouldn't fill them because he gets percocet instead.  States that he has been taking 4-5 percocet at a time to ease the pain/

## 2015-08-02 ENCOUNTER — Emergency Department (HOSPITAL_BASED_OUTPATIENT_CLINIC_OR_DEPARTMENT_OTHER)
Admission: EM | Admit: 2015-08-02 | Discharge: 2015-08-02 | Disposition: A | Discharge status: Home / Self Care | Payer: Medicare Other | Attending: Emergency Medicine | Admitting: Emergency Medicine

## 2015-08-02 ENCOUNTER — Encounter (HOSPITAL_BASED_OUTPATIENT_CLINIC_OR_DEPARTMENT_OTHER): Payer: Self-pay | Admitting: *Deleted

## 2015-08-02 DIAGNOSIS — G8929 Other chronic pain: Secondary | ICD-10-CM | POA: Diagnosis not present

## 2015-08-02 DIAGNOSIS — X58XXXA Exposure to other specified factors, initial encounter: Secondary | ICD-10-CM | POA: Diagnosis not present

## 2015-08-02 DIAGNOSIS — S161XXA Strain of muscle, fascia and tendon at neck level, initial encounter: Secondary | ICD-10-CM | POA: Insufficient documentation

## 2015-08-02 DIAGNOSIS — S299XXA Unspecified injury of thorax, initial encounter: Secondary | ICD-10-CM | POA: Insufficient documentation

## 2015-08-02 DIAGNOSIS — Z880 Allergy status to penicillin: Secondary | ICD-10-CM | POA: Diagnosis not present

## 2015-08-02 DIAGNOSIS — S199XXA Unspecified injury of neck, initial encounter: Secondary | ICD-10-CM | POA: Diagnosis present

## 2015-08-02 DIAGNOSIS — Z8659 Personal history of other mental and behavioral disorders: Secondary | ICD-10-CM | POA: Diagnosis not present

## 2015-08-02 DIAGNOSIS — S3992XA Unspecified injury of lower back, initial encounter: Secondary | ICD-10-CM | POA: Diagnosis not present

## 2015-08-02 DIAGNOSIS — Y9289 Other specified places as the place of occurrence of the external cause: Secondary | ICD-10-CM | POA: Insufficient documentation

## 2015-08-02 DIAGNOSIS — M109 Gout, unspecified: Secondary | ICD-10-CM | POA: Diagnosis not present

## 2015-08-02 DIAGNOSIS — F17210 Nicotine dependence, cigarettes, uncomplicated: Secondary | ICD-10-CM | POA: Diagnosis not present

## 2015-08-02 DIAGNOSIS — Y998 Other external cause status: Secondary | ICD-10-CM | POA: Diagnosis not present

## 2015-08-02 DIAGNOSIS — Y9389 Activity, other specified: Secondary | ICD-10-CM | POA: Insufficient documentation

## 2015-08-02 MED ORDER — IBUPROFEN 800 MG PO TABS
800.0000 mg | ORAL_TABLET | Freq: Once | ORAL | Status: AC
  Administered 2015-08-02: 800 mg via ORAL
  Filled 2015-08-02: qty 1

## 2015-08-02 MED ORDER — DIAZEPAM 2 MG PO TABS
2.0000 mg | ORAL_TABLET | Freq: Once | ORAL | Status: AC
  Administered 2015-08-02: 2 mg via ORAL
  Filled 2015-08-02: qty 1

## 2015-08-02 MED ORDER — IBUPROFEN 800 MG PO TABS: 800.0000 mg | ORAL_TABLET | Freq: Three times a day (TID) | ORAL | Status: DC
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
## 2015-08-02 NOTE — ED Notes (Signed)
States he woke with a burning pulling sensation in his neck. He went to work and pain continued throughout the day. He thinks he pulled a muscle.

## 2015-08-02 NOTE — Discharge Instructions (Signed)
Cervical Sprain  A cervical sprain is an injury in the neck in which the strong, fibrous tissues (ligaments) that connect your neck bones stretch or tear. Cervical sprains can range from mild to severe. Severe cervical sprains can cause the neck vertebrae to be unstable. This can lead to damage of the spinal cord and can result in serious nervous system problems. The amount of time it takes for a cervical sprain to get better depends on the cause and extent of the injury. Most cervical sprains heal in 1 to 3 weeks.  CAUSES   Severe cervical sprains may be caused by:    Contact sport injuries (such as from football, rugby, wrestling, hockey, auto racing, gymnastics, diving, martial arts, or boxing).    Motor vehicle collisions.    Whiplash injuries. This is an injury from a sudden forward and backward whipping movement of the head and neck.   Falls.   Mild cervical sprains may be caused by:    Being in an awkward position, such as while cradling a telephone between your ear and shoulder.    Sitting in a chair that does not offer proper support.    Working at a poorly designed computer station.    Looking up or down for long periods of time.   SYMPTOMS    Pain, soreness, stiffness, or a burning sensation in the front, back, or sides of the neck. This discomfort may develop immediately after the injury or slowly, 24 hours or more after the injury.    Pain or tenderness directly in the middle of the back of the neck.    Shoulder or upper back pain.    Limited ability to move the neck.    Headache.    Dizziness.    Weakness, numbness, or tingling in the hands or arms.    Muscle spasms.    Difficulty swallowing or chewing.    Tenderness and swelling of the neck.   DIAGNOSIS   Most of the time your health care provider can diagnose a cervical sprain by taking your history and doing a physical exam. Your health care provider will ask about previous neck injuries and any known neck  problems, such as arthritis in the neck. X-rays may be taken to find out if there are any other problems, such as with the bones of the neck. Other tests, such as a CT scan or MRI, may also be needed.   TREATMENT   Treatment depends on the severity of the cervical sprain. Mild sprains can be treated with rest, keeping the neck in place (immobilization), and pain medicines. Severe cervical sprains are immediately immobilized. Further treatment is done to help with pain, muscle spasms, and other symptoms and may include:   Medicines, such as pain relievers, numbing medicines, or muscle relaxants.    Physical therapy. This may involve stretching exercises, strengthening exercises, and posture training. Exercises and improved posture can help stabilize the neck, strengthen muscles, and help stop symptoms from returning.   HOME CARE INSTRUCTIONS    Put ice on the injured area.     Put ice in a plastic bag.     Place a towel between your skin and the bag.     Leave the ice on for 15-20 minutes, 3-4 times a day.    If your injury was severe, you may have been given a cervical collar to wear. A cervical collar is a two-piece collar designed to keep your neck from moving while it heals.      Do not remove the collar unless instructed by your health care provider.    If you have long hair, keep it outside of the collar.    Ask your health care provider before making any adjustments to your collar. Minor adjustments may be required over time to improve comfort and reduce pressure on your chin or on the back of your head.    Ifyou are allowed to remove the collar for cleaning or bathing, follow your health care provider's instructions on how to do so safely.    Keep your collar clean by wiping it with mild soap and water and drying it completely. If the collar you have been given includes removable pads, remove them every 1-2 days and hand wash them with soap and water. Allow them to air dry. They should be completely  dry before you wear them in the collar.    If you are allowed to remove the collar for cleaning and bathing, wash and dry the skin of your neck. Check your skin for irritation or sores. If you see any, tell your health care provider.    Do not drive while wearing the collar.    Only take over-the-counter or prescription medicines for pain, discomfort, or fever as directed by your health care provider.    Keep all follow-up appointments as directed by your health care provider.    Keep all physical therapy appointments as directed by your health care provider.    Make any needed adjustments to your workstation to promote good posture.    Avoid positions and activities that make your symptoms worse.    Warm up and stretch before being active to help prevent problems.   SEEK MEDICAL CARE IF:    Your pain is not controlled with medicine.    You are unable to decrease your pain medicine over time as planned.    Your activity level is not improving as expected.   SEEK IMMEDIATE MEDICAL CARE IF:    You develop any bleeding.   You develop stomach upset.   You have signs of an allergic reaction to your medicine.    Your symptoms get worse.    You develop new, unexplained symptoms.    You have numbness, tingling, weakness, or paralysis in any part of your body.   MAKE SURE YOU:    Understand these instructions.   Will watch your condition.   Will get help right away if you are not doing well or get worse.     This information is not intended to replace advice given to you by your health care provider. Make sure you discuss any questions you have with your health care provider.     Document Released: 04/05/2007 Document Revised: 06/13/2013 Document Reviewed: 12/14/2012  Elsevier Interactive Patient Education 2016 [employer]

## 2016-05-20 ENCOUNTER — Encounter (HOSPITAL_BASED_OUTPATIENT_CLINIC_OR_DEPARTMENT_OTHER): Payer: Self-pay | Admitting: Emergency Medicine

## 2016-05-20 ENCOUNTER — Emergency Department (HOSPITAL_BASED_OUTPATIENT_CLINIC_OR_DEPARTMENT_OTHER)
Admission: EM | Admit: 2016-05-20 | Discharge: 2016-05-20 | Disposition: A | Discharge status: Home / Self Care | Payer: Medicare Other | Attending: Emergency Medicine | Admitting: Emergency Medicine

## 2016-05-20 DIAGNOSIS — K0889 Other specified disorders of teeth and supporting structures: Secondary | ICD-10-CM | POA: Diagnosis not present

## 2016-05-20 DIAGNOSIS — F17210 Nicotine dependence, cigarettes, uncomplicated: Secondary | ICD-10-CM | POA: Insufficient documentation

## 2016-05-20 MED ORDER — CLINDAMYCIN HCL 300 MG PO CAPS: 300.0000 mg | ORAL_CAPSULE | Freq: Four times a day (QID) | ORAL | 0 refills | Status: DC

## 2016-05-20 MED ORDER — TRAMADOL HCL 50 MG PO TABS: 50.0000 mg | ORAL_TABLET | Freq: Four times a day (QID) | ORAL | 0 refills | Status: DC | PRN

## 2016-05-20 NOTE — ED Provider Notes (Signed)
[facility] Provider [facility]   CSN: [id number] Arrival date & time: 05/20/16  [id number]     History   Chief Complaint Chief Complaint  Patient presents with  . Dental Pain    HPI Virgina JockBrandon Edmondson is a 35 y.o. male.  Patient is a 35 year old male with history of chronic back pain, gout, and PTSD. He presents for evaluation of dental pain. He tells me he is eating of these candies several days ago and shipped several teeth on the right lower jaw. He has been in pain since that time. He denies any fevers, chills, oral swelling, or difficulty swallowing or breathing.   The history is provided by the patient.  Dental Pain   This is a new problem. The current episode started 2 days ago. The problem occurs constantly. The problem has been gradually worsening.    Past Medical History:  Diagnosis Date  . Chronic back pain   . Gout   . PTSD (post-traumatic stress disorder)     Patient Active Problem List   Diagnosis Date Noted  . No significant past medical history   . PTSD (post-traumatic stress disorder)     Past Surgical History:  Procedure Laterality Date  . KNEE SURGERY Bilateral   . MIDDLE EAR SURGERY         Home Medications    Prior to Admission medications   Medication Sig Start Date End Date Taking? Authorizing Provider  ibuprofen (ADVIL,MOTRIN) 800 MG tablet Take 1 tablet (800 mg total) by mouth 3 (three) times daily. 08/02/15   Glynn OctaveStephen Rancour, MD  oxyCODONE-acetaminophen (PERCOCET) 7.5-325 MG tablet Take 1 tablet by mouth every 4 (four) hours as needed for severe pain.    Historical Provider, MD    Family History No family history on file.  Social History Social History  Substance Use Topics  . Smoking status: Current Some Day Smoker    Packs/day: 0.50    Types: Cigarettes  . Smokeless tobacco: Never Used  . Alcohol use No     Allergies   Amoxicillin and Penicillins   Review of Systems Review of Systems  All other systems reviewed  and are negative.    Physical Exam Updated Vital Signs BP 120/82 (BP Location: Left Arm)   Pulse 86   Temp 98.3 F (36.8 C) (Oral)   Resp 20   Ht 5\' 7"  (1.702 m)   Wt 165 lb (74.8 kg)   SpO2 98%   BMI 25.84 kg/m   Physical Exam  Constitutional: He is oriented to person, place, and time. He appears well-developed and well-nourished. No distress.  HENT:  Head: Normocephalic and atraumatic.  There is poor dentition throughout. There are fillings and significant decay amongst the upper and lower molars bilaterally. There is no stridor or facial swelling. There is some gingival inflammation.  Neck: Normal range of motion. Neck supple.  Cardiovascular: Normal rate and regular rhythm.  Exam reveals no friction rub.   No murmur heard. Pulmonary/Chest: Effort normal and breath sounds normal. No respiratory distress. He has no wheezes. He has no rales.  Abdominal: Soft. Bowel sounds are normal. He exhibits no distension. There is no tenderness.  Musculoskeletal: Normal range of motion. He exhibits no edema.  Neurological: He is alert and oriented to person, place, and time. Coordination normal.  Skin: Skin is warm and dry. He is not diaphoretic.  Nursing note and vitals reviewed.    ED Treatments / Results  Labs (all labs ordered are listed, but only  abnormal results are displayed) Labs Reviewed - No data to display  EKG  EKG Interpretation None       Radiology No results found.  Procedures Procedures (including critical care time)  Medications Ordered in ED Medications - No data to display   Initial Impression / Assessment and Plan / ED Course  I have reviewed the triage vital signs and the nursing notes.  Pertinent labs & imaging results that were available during my care of the patient were reviewed by me and considered in my medical decision making (see chart for details).  Clinical Course     Patient with dental pain, however no evidence for abscess or  complication. He will be given antibiotics, tramadol, and advised to follow-up with dentistry.  Final Clinical Impressions(s) / ED Diagnoses   Final diagnoses:  None    New Prescriptions New Prescriptions   No medications on file     Geoffery Lyonsouglas Bartholomew Ramesh, MD 05/20/16 [phone]

## 2016-05-20 NOTE — Discharge Instructions (Signed)
Clindamycin as prescribed. Tramadol as prescribed as needed for pain.  Follow-up with dentistry. The contact information for Dr. Mayford Knifeurner has been provided in this discharge summary for you to call and make these arrangements.

## 2016-05-20 NOTE — ED Triage Notes (Signed)
Pt states he cracked a tooth on right lower side x 3 days ago

## 2016-06-11 ENCOUNTER — Encounter (HOSPITAL_BASED_OUTPATIENT_CLINIC_OR_DEPARTMENT_OTHER): Payer: Self-pay | Admitting: Emergency Medicine

## 2016-06-11 ENCOUNTER — Emergency Department (HOSPITAL_BASED_OUTPATIENT_CLINIC_OR_DEPARTMENT_OTHER)
Admission: EM | Admit: 2016-06-11 | Discharge: 2016-06-11 | Disposition: A | Discharge status: Home / Self Care | Payer: Medicare Other | Attending: Dermatology | Admitting: Dermatology

## 2016-06-11 DIAGNOSIS — K0381 Cracked tooth: Secondary | ICD-10-CM | POA: Diagnosis not present

## 2016-06-11 DIAGNOSIS — F17210 Nicotine dependence, cigarettes, uncomplicated: Secondary | ICD-10-CM | POA: Diagnosis not present

## 2016-06-11 DIAGNOSIS — Z5321 Procedure and treatment not carried out due to patient leaving prior to being seen by health care provider: Secondary | ICD-10-CM | POA: Diagnosis not present

## 2016-06-11 DIAGNOSIS — Z79899 Other long term (current) drug therapy: Secondary | ICD-10-CM | POA: Diagnosis not present

## 2016-06-11 DIAGNOSIS — Z791 Long term (current) use of non-steroidal anti-inflammatories (NSAID): Secondary | ICD-10-CM | POA: Insufficient documentation

## 2016-06-11 DIAGNOSIS — K0889 Other specified disorders of teeth and supporting structures: Secondary | ICD-10-CM | POA: Diagnosis present

## 2016-06-11 HISTORY — DX: Long term (current) use of opiate analgesic: Z79.891

## 2016-06-11 NOTE — ED Notes (Signed)
Pt standing outside room, he was told that he was next to be seen, his response was, "you all aren't doing anything, some of you all need to be fxxxing fired."  Pt asked for discharge papers, told that he does not have discharge papers but if he wanted to leave, he is able to do so.

## 2016-06-11 NOTE — ED Notes (Signed)
Pt rang out to ask when the doctor was coming to see him.  Nurse assured pt that he would be seen very soon, pt states he is in pain.

## 2016-06-11 NOTE — ED Notes (Signed)
Pt c/o chronic dental pain that is not relieved by OTC medications.  He told [facility] that his pain was not relieved by his percocet, did not disclose to this nurse that he had been taking percocet.  When asked, he said he was out and that he does not see his PCP until after the first of the year.  Pt also states that he "just moved to the area," but has been seen multiple times at this ED and at [facility].  Pt states he did not schedule a dental appointment, claims that none of them take medicaid.  Pt was advised that [facility] is very inexpensive per tooth extraction if needed.

## 2016-06-11 NOTE — ED Triage Notes (Signed)
Pt states he cracked a tooth today while eating candy.

## 2016-12-28 ENCOUNTER — Emergency Department (HOSPITAL_BASED_OUTPATIENT_CLINIC_OR_DEPARTMENT_OTHER)
Admission: EM | Admit: 2016-12-28 | Discharge: 2016-12-28 | Disposition: A | Discharge status: Home / Self Care | Payer: Medicare Other | Attending: Emergency Medicine | Admitting: Emergency Medicine

## 2016-12-28 ENCOUNTER — Encounter (HOSPITAL_BASED_OUTPATIENT_CLINIC_OR_DEPARTMENT_OTHER): Payer: Self-pay | Admitting: *Deleted

## 2016-12-28 DIAGNOSIS — M25511 Pain in right shoulder: Secondary | ICD-10-CM | POA: Insufficient documentation

## 2016-12-28 DIAGNOSIS — Z5321 Procedure and treatment not carried out due to patient leaving prior to being seen by health care provider: Secondary | ICD-10-CM | POA: Insufficient documentation

## 2016-12-28 NOTE — ED Triage Notes (Signed)
Pt c/o right shoulder pain w/o injury x 2 days  

## 2016-12-28 NOTE — ED Notes (Addendum)
Pt not found in lobby, bistro or parking lot  Pt called x 3

## 2017-06-23 ENCOUNTER — Encounter (HOSPITAL_BASED_OUTPATIENT_CLINIC_OR_DEPARTMENT_OTHER): Payer: Self-pay | Admitting: Emergency Medicine

## 2017-06-23 ENCOUNTER — Other Ambulatory Visit: Payer: Self-pay

## 2017-06-23 ENCOUNTER — Emergency Department (HOSPITAL_BASED_OUTPATIENT_CLINIC_OR_DEPARTMENT_OTHER)
Admission: EM | Admit: 2017-06-23 | Discharge: 2017-06-23 | Disposition: A | Discharge status: Home / Self Care | Payer: Medicare Other | Attending: Emergency Medicine | Admitting: Emergency Medicine

## 2017-06-23 DIAGNOSIS — F17210 Nicotine dependence, cigarettes, uncomplicated: Secondary | ICD-10-CM | POA: Diagnosis not present

## 2017-06-23 DIAGNOSIS — K029 Dental caries, unspecified: Secondary | ICD-10-CM | POA: Diagnosis not present

## 2017-06-23 DIAGNOSIS — K0889 Other specified disorders of teeth and supporting structures: Secondary | ICD-10-CM | POA: Diagnosis present

## 2017-06-23 HISTORY — DX: Malingerer (conscious simulation): Z76.5

## 2017-06-23 MED ORDER — NAPROXEN 500 MG PO TABS: 500.0000 mg | ORAL_TABLET | Freq: Two times a day (BID) | ORAL | 0 refills | Status: DC | PRN

## 2017-06-23 MED ORDER — CLINDAMYCIN HCL 150 MG PO CAPS: 450.0000 mg | ORAL_CAPSULE | Freq: Three times a day (TID) | ORAL | 0 refills | Status: AC

## 2017-06-23 NOTE — ED Notes (Signed)
Pt states he wants something stronger than naproxen for pain , PA states no other medication would be written for , pt informed

## 2017-06-23 NOTE — Discharge Instructions (Signed)
Please read instructions below. °Take the antibiotic, Clindamycin, 3 times per day until they are gone. °You can take Naproxen up to 2 times per day with meals, as needed for pain. °Schedule an appointment with a dentist, using the dental resource guide attached. °Return to the ER for difficulty swallowing or breathing, fever, or new or worsening symptoms. ° °

## 2017-06-23 NOTE — ED Provider Notes (Signed)
[facility] EMERGENCY DEPARTMENT Provider Note   CSN: [id number] Arrival date & time: 06/23/17  1740     History   Chief Complaint Chief Complaint  Patient presents with  . Dental Pain    HPI Jeffrey Crawford is a 37 y.o. male presenting to the ED complaining of dental pain located right lower mouth. Pt states pain began 3 days ago, described as throbbing and worse with hot and cold drinks. Has not taken any medications for symptoms. Denies difficulty swallowing or breathing, fever, chills, N/V, drainage in mouth, or other symptoms today. Does not have a local dentist.  The history is provided by the patient.    Past Medical History:  Diagnosis Date  . Chronic back pain   . Chronic prescription opiate use   . Drug-seeking behavior   . Gout   . PTSD (post-traumatic stress disorder)     Patient Active Problem List   Diagnosis Date Noted  . No significant past medical history   . PTSD (post-traumatic stress disorder)     Past Surgical History:  Procedure Laterality Date  . KNEE SURGERY Bilateral   . MIDDLE EAR SURGERY         Home Medications    Prior to Admission medications   Medication Sig Start Date End Date Taking? Authorizing Provider  ALPRAZolam Prudy Feeler(XANAX) 1 MG tablet Take 1 mg by mouth at bedtime as needed for anxiety.    [provider]  clindamycin (CLEOCIN) 150 MG capsule Take 3 capsules (450 mg total) by mouth 3 (three) times daily for 7 days. 06/23/17 06/30/17  Robinson, [place] N, PA-C  naproxen (NAPROSYN) 500 MG tablet Take 1 tablet (500 mg total) by mouth 2 (two) times daily as needed. 06/23/17   Robinson, [place] N, PA-C  oxyCODONE-acetaminophen (PERCOCET) 7.5-325 MG tablet Take 1 tablet by mouth every 4 (four) hours as needed for severe pain.    [provider]  traMADol (ULTRAM) 50 MG tablet Take 1 tablet (50 mg total) by mouth every 6 (six) hours as needed. 05/20/16   Geoffery Lyonselo, Douglas, MD    Family History History reviewed. No  pertinent family history.  Social History Social History   Tobacco Use  . Smoking status: Current Some Day Smoker    Packs/day: 0.50    Types: Cigarettes  . Smokeless tobacco: Never Used  Substance Use Topics  . Alcohol use: No  . Drug use: Yes    Types: Marijuana     Allergies   Amoxicillin and Penicillins   Review of Systems Review of Systems  Constitutional: Negative for fever.  HENT: Positive for dental problem. Negative for facial swelling, sore throat, trouble swallowing and voice change.      Physical Exam Updated Vital Signs BP (!) 140/97 (BP Location: Left Arm)   Pulse 85   Temp 98.7 F (37.1 C) (Oral)   Resp 18   Ht 5\' 7"  (1.702 m)   Wt 92.5 kg (204 lb)   SpO2 100%   BMI 31.95 kg/m   Physical Exam  Constitutional: He appears well-developed and well-nourished. No distress.  Well-Appearing, tolerating secretions.  HENT:  Head: Normocephalic and atraumatic.  Mouth/Throat: Uvula is midline and oropharynx is clear and moist. No trismus in the jaw. No uvula swelling.    Poor dentition throughout. tooth #20 with tenderness, and decay.  Gingival erythema surrounding tooth, however without fluctuant dental abscess. No Edema or tenderness suggestive of deep space infection.  Eyes: Conjunctivae are normal.  Pulmonary/Chest: Effort normal.  Psychiatric: He has a normal mood and affect. His behavior is normal.  Nursing note and vitals reviewed.    ED Treatments / Results  Labs (all labs ordered are listed, but only abnormal results are displayed) Labs Reviewed - No data to display  EKG  EKG Interpretation None       Radiology No results found.  Procedures Procedures (including critical care time)  Medications Ordered in ED Medications - No data to display   Initial Impression / Assessment and Plan / ED Course  I have reviewed the triage vital signs and the nursing notes.  Pertinent labs & imaging results that were available during my care  of the patient were reviewed by me and considered in my medical decision making (see chart for details).     Patient with dental caries.  No gross abscess.  VSS, afebrile, tolerating secretions. Exam unconcerning for peritonsillar abscess, Ludwig's angina or spread of infection.  Will treat with clindamycin and pain medicine.  Urged patient to follow-up with dentist. Pt safe for discharge.  Discussed results, findings, treatment and follow up. Patient advised of return precautions. Patient verbalized understanding and agreed with plan.  Final Clinical Impressions(s) / ED Diagnoses   Final diagnoses:  Pain due to dental caries    ED Discharge Orders        Ordered    clindamycin (CLEOCIN) 150 MG capsule  3 times daily     06/23/17 2013    naproxen (NAPROSYN) 500 MG tablet  2 times daily PRN     06/23/17 2013       Robinson, [place] N, PA-C 06/23/17 2242    Little, Ambrose Finland, MD 06/25/17 1454

## 2017-06-23 NOTE — ED Triage Notes (Signed)
Patient states that he is having pain to his left upper gums and mouth x 2 -3 days

## 2017-09-16 ENCOUNTER — Emergency Department (HOSPITAL_BASED_OUTPATIENT_CLINIC_OR_DEPARTMENT_OTHER)
Admission: EM | Admit: 2017-09-16 | Discharge: 2017-09-16 | Disposition: A | Discharge status: Home / Self Care | Payer: Medicare Other | Attending: Emergency Medicine | Admitting: Emergency Medicine

## 2017-09-16 ENCOUNTER — Other Ambulatory Visit: Payer: Self-pay

## 2017-09-16 ENCOUNTER — Encounter (HOSPITAL_BASED_OUTPATIENT_CLINIC_OR_DEPARTMENT_OTHER): Payer: Self-pay

## 2017-09-16 DIAGNOSIS — Z79899 Other long term (current) drug therapy: Secondary | ICD-10-CM | POA: Diagnosis not present

## 2017-09-16 DIAGNOSIS — K0889 Other specified disorders of teeth and supporting structures: Secondary | ICD-10-CM | POA: Insufficient documentation

## 2017-09-16 DIAGNOSIS — F17210 Nicotine dependence, cigarettes, uncomplicated: Secondary | ICD-10-CM | POA: Diagnosis not present

## 2017-09-16 DIAGNOSIS — M542 Cervicalgia: Secondary | ICD-10-CM | POA: Diagnosis not present

## 2017-09-16 MED ORDER — CLINDAMYCIN HCL 150 MG PO CAPS: 450.0000 mg | ORAL_CAPSULE | Freq: Three times a day (TID) | ORAL | 0 refills | Status: DC

## 2017-09-16 MED ORDER — PROBIOTIC 250 MG PO CAPS: 1.0000 | ORAL_CAPSULE | Freq: Every day | ORAL | 0 refills | Status: AC

## 2017-09-16 MED ORDER — ACETAMINOPHEN 500 MG PO TABS: 500.0000 mg | ORAL_TABLET | Freq: Four times a day (QID) | ORAL | 0 refills | Status: AC | PRN

## 2017-09-16 MED ORDER — NAPROXEN 500 MG PO TABS: 500.0000 mg | ORAL_TABLET | Freq: Two times a day (BID) | ORAL | 0 refills | Status: DC

## 2017-09-16 MED FILL — CLINDAMYCIN HCL 150 MG CAPS: 150 | 7 days supply | Qty: 63 | Fill #0

## 2017-09-16 MED FILL — NAPROXEN 500 MG TABLET: 500 | 15 days supply | Qty: 30 | Fill #0

## 2017-09-16 NOTE — ED Provider Notes (Signed)
[facility] EMERGENCY DEPARTMENT Provider Note   CSN: [id number] Arrival date & time: 09/16/17  1424     History   Chief Complaint Chief Complaint  Patient presents with  . Dental Injury    HPI Jeffrey Crawford is a 37 y.o. male with history of chronic back pain, chronic Rx opiate use, PTSD who presents with a 2 month history of dental pain after cracking 2 of his teeth on a piece of chicken. He reports he has had pain since that time. He has tried Advil and Orajel without relief. He denies fever. He has had intermittent, anterior neck pain that hurts with chewing. He denies any of complaints.  HPI  Past Medical History:  Diagnosis Date  . Chronic back pain   . Chronic prescription opiate use   . Drug-seeking behavior   . Gout   . PTSD (post-traumatic stress disorder)     Patient Active Problem List   Diagnosis Date Noted  . No significant past medical history   . PTSD (post-traumatic stress disorder)     Past Surgical History:  Procedure Laterality Date  . KNEE SURGERY Bilateral   . MIDDLE EAR SURGERY          Home Medications    Prior to Admission medications   Medication Sig Start Date End Date Taking? Authorizing Provider  acetaminophen (TYLENOL) 500 MG tablet Take 1 tablet (500 mg total) by mouth every 6 (six) hours as needed. 09/16/17   Ernestina Joe, Waylan Boga, PA-C  ALPRAZolam Prudy Feeler) 1 MG tablet Take 1 mg by mouth at bedtime as needed for anxiety.    [provider]  clindamycin (CLEOCIN) 150 MG capsule Take 3 capsules (450 mg total) by mouth 3 (three) times daily. 09/16/17   Crit Obremski, Waylan Boga, PA-C  naproxen (NAPROSYN) 500 MG tablet Take 1 tablet (500 mg total) by mouth 2 (two) times daily. 09/16/17   Tylan Briguglio, Waylan Boga, PA-C  oxyCODONE-acetaminophen (PERCOCET) 7.5-325 MG tablet Take 1 tablet by mouth every 4 (four) hours as needed for severe pain.    [provider]  Saccharomyces boulardii (PROBIOTIC) 250 MG CAPS Take 1 capsule by mouth  daily. 09/16/17   Denzel Etienne, Waylan Boga, PA-C  traMADol (ULTRAM) 50 MG tablet Take 1 tablet (50 mg total) by mouth every 6 (six) hours as needed. 05/20/16   Geoffery Lyons, MD    Family History No family history on file.  Social History Social History   Tobacco Use  . Smoking status: Current Some Day Smoker    Packs/day: 0.50    Types: Cigarettes  . Smokeless tobacco: Never Used  Substance Use Topics  . Alcohol use: No  . Drug use: Not Currently     Allergies   Amoxicillin and Penicillins   Review of Systems Review of Systems  Constitutional: Negative for fever.  HENT: Positive for dental problem.   Musculoskeletal: Positive for neck pain.     Physical Exam Updated Vital Signs BP 130/80 (BP Location: Right Arm)   Pulse 75   Temp 98.3 F (36.8 C) (Oral)   Resp 16   Ht 5\' 7"  (1.702 m)   Wt 95.3 kg (210 lb)   SpO2 100%   BMI 32.89 kg/m   Physical Exam  Constitutional: He appears well-developed and well-nourished. No distress.  HENT:  Head: Normocephalic and atraumatic.  Mouth/Throat: Oropharynx is clear and moist. Abnormal dentition. No oropharyngeal exudate.    Eyes: Pupils are equal, round, and reactive to light. Conjunctivae are normal. Right  eye exhibits no discharge. Left eye exhibits no discharge. No scleral icterus.  Neck: Normal range of motion. Neck supple. No thyromegaly present.  No tenderness or masses  Cardiovascular: Normal rate, regular rhythm, normal heart sounds and intact distal pulses. Exam reveals no gallop and no friction rub.  No murmur heard. Pulmonary/Chest: Effort normal and breath sounds normal. No stridor. No respiratory distress. He has no wheezes. He has no rales.  Musculoskeletal: He exhibits no edema.  Lymphadenopathy:    He has no cervical adenopathy.  Neurological: He is alert. Coordination normal.  Skin: Skin is warm and dry. No rash noted. He is not diaphoretic. No pallor.  Psychiatric: He has a normal mood and affect.  Nursing  note and vitals reviewed.    ED Treatments / Results  Labs (all labs ordered are listed, but only abnormal results are displayed) Labs Reviewed - No data to display  EKG None  Radiology No results found.  Procedures Procedures (including critical care time)  Medications Ordered in ED Medications - No data to display   Initial Impression / Assessment and Plan / ED Course  I have reviewed the triage vital signs and the nursing notes.  Pertinent labs & imaging results that were available during my care of the patient were reviewed by me and considered in my medical decision making (see chart for details).     Patient with dentalgia.  No abscess requiring immediate incision and drainage.  Exam not concerning for Ludwig's angina or pharyngeal abscess.  Will treat with clindamycin, naprosyn, tylenol. I recommended probiotics. Pt instructed to follow-up with dentist. Patient given resources.  Discussed return precautions. Patient understands and agrees with plan. Patient vitals stable throughout ED course and discharged in satisfactory condition.  Final Clinical Impressions(s) / ED Diagnoses   Final diagnoses:  Pain, dental    ED Discharge Orders        Ordered    clindamycin (CLEOCIN) 150 MG capsule  3 times daily     09/16/17 1513    Saccharomyces boulardii (PROBIOTIC) 250 MG CAPS  Daily     09/16/17 1513    naproxen (NAPROSYN) 500 MG tablet  2 times daily     09/16/17 1513    acetaminophen (TYLENOL) 500 MG tablet  Every 6 hours PRN     09/16/17 1513       Emi HolesLaw, Ezella Kell M, PA-C 09/16/17 1520    Long, Arlyss RepressJoshua G, MD 09/16/17 1728

## 2017-09-16 NOTE — Discharge Instructions (Signed)
Take clindamycin until completed. Take with probiotic. Take Naprosyn twice daily. Do not combine with Advil. You can alternate with Tylenol as prescribed. Please see a dentist as soon as possible. Resources are attached. Call Dr. Leanord AsalFarless' office within 24 hours and bring this discharge summary to your appointment and [facility] should cover part of your visit.

## 2017-09-16 NOTE — ED Triage Notes (Signed)
C/o left lower and right upper teeth "chipped" x 2 months-no dentist visit-NAD-steady gait

## 2017-11-02 ENCOUNTER — Other Ambulatory Visit: Payer: Self-pay

## 2017-11-02 ENCOUNTER — Emergency Department (HOSPITAL_BASED_OUTPATIENT_CLINIC_OR_DEPARTMENT_OTHER)
Admission: EM | Admit: 2017-11-02 | Discharge: 2017-11-02 | Disposition: A | Discharge status: Home / Self Care | Payer: Medicare Other | Attending: Emergency Medicine | Admitting: Emergency Medicine

## 2017-11-02 ENCOUNTER — Encounter (HOSPITAL_BASED_OUTPATIENT_CLINIC_OR_DEPARTMENT_OTHER): Payer: Self-pay

## 2017-11-02 DIAGNOSIS — Z79899 Other long term (current) drug therapy: Secondary | ICD-10-CM | POA: Insufficient documentation

## 2017-11-02 DIAGNOSIS — R509 Fever, unspecified: Secondary | ICD-10-CM | POA: Insufficient documentation

## 2017-11-02 DIAGNOSIS — K0381 Cracked tooth: Secondary | ICD-10-CM | POA: Diagnosis not present

## 2017-11-02 DIAGNOSIS — F17210 Nicotine dependence, cigarettes, uncomplicated: Secondary | ICD-10-CM | POA: Diagnosis not present

## 2017-11-02 DIAGNOSIS — K0889 Other specified disorders of teeth and supporting structures: Secondary | ICD-10-CM

## 2017-11-02 DIAGNOSIS — K029 Dental caries, unspecified: Secondary | ICD-10-CM | POA: Insufficient documentation

## 2017-11-02 MED ORDER — CLINDAMYCIN HCL 150 MG PO CAPS: 450.0000 mg | ORAL_CAPSULE | Freq: Three times a day (TID) | ORAL | 0 refills | Status: AC

## 2017-11-02 MED ORDER — NAPROXEN 375 MG PO TABS: 375.0000 mg | ORAL_TABLET | Freq: Two times a day (BID) | ORAL | 0 refills | Status: DC

## 2017-11-02 MED FILL — NAPROXEN 375 MG TABLET: 375 | 5 days supply | Qty: 10 | Fill #0

## 2017-11-02 MED FILL — CLINDAMYCIN HCL 150 MG CAPS: 150 | 7 days supply | Qty: 63 | Fill #0

## 2017-11-02 NOTE — Discharge Instructions (Signed)
Take antibiotics as directed. Please take all of your antibiotics until finished.  You can take Tylenol or Ibuprofen as directed for pain. You can alternate Tylenol and Ibuprofen every 4 hours. If you take Tylenol at 1pm, then you can take Ibuprofen at 5pm. Then you can take Tylenol again at 9pm.   The exam and treatment you received today has been provided on an emergency basis only. This is not a substitute for complete medical or dental care. If your problem worsens or new symptoms (problems) appear, and you are unable to arrange prompt follow-up care with your dentist, call or return to this location. If you do not have a dentist, please follow-up with one on the list provided  CALL YOUR DENTIST OR RETURN IMMEDIATELY IF you develop a fever, rash, difficulty breathing or swallowing, neck or facial swelling, or other potentially serious concerns.   Please follow-up with one of the dental clinics provided to you below or in your paperwork. Call and tell them you were seen in the Emergency Dept and arrange for an appointment. You may have to call multiple places in order to find a place to be seen.  Dental Assistance If the dentist on-call cannot see you, please use the resources below:   Patients with Medicaid: [facility] [address], [phone], [phone]  If unable to pay, or uninsured, contact [facility] ([phone]) or [facility] ([phone] in [place], [phone] in [place]) to become qualified for the adult dental clinic  Other Low-Cost Community Dental Services: [facility]- [address], [place], [place], [address]    [phone], Ext. [phone]    2nd and 4th Thursday of the month at 6:30am    10 clients each day by appointment, can sometimes see walk-in     patients if someone does not show for an appointment [facility]- [address], [place], [place], [address]    [phone]  [facility]- [address], [place], [place], [address]    [phone]  [facility]- [phone] [facility]- [phone] [facility]- [phone]  

## 2017-11-02 NOTE — ED Triage Notes (Signed)
C/o left top, right bottom toothache x 3 days-NAD-steady gait

## 2017-11-02 NOTE — ED Notes (Signed)
ED Provider at bedside. 

## 2017-11-02 NOTE — ED Provider Notes (Signed)
[facility] EMERGENCY DEPARTMENT Provider Note   CSN: [id number] Arrival date & time: 11/02/17  1105     History   Chief Complaint Chief Complaint  Patient presents with  . Dental Pain    HPI Jeffrey Crawford is a 37 y.o. male for evaluation of dental pain that began yesterday.  Patient reports that he was eating hard candies and states that when he is on them, it caused his teeth to crack on the right upper and left upper side.  Patient reports that since then he has had pain.  Patient reports that he has had some swelling to the lower face bilaterally.  Patient reports that he measured a fever last night but states he does not recall what the number was.  Patient states he has not had any fever today.  Patient reports he has been taking over-the-counter Tylenol, ibuprofen for pain relief with no improvement in symptoms.  She reports he does not have a dentist.  Patient denies any vomiting, difficulty breathing.  The history is provided by the patient.    Past Medical History:  Diagnosis Date  . Chronic back pain   . Chronic prescription opiate use   . Drug-seeking behavior   . Gout   . PTSD (post-traumatic stress disorder)     Patient Active Problem List   Diagnosis Date Noted  . No significant past medical history   . PTSD (post-traumatic stress disorder)     Past Surgical History:  Procedure Laterality Date  . KNEE SURGERY Bilateral   . MIDDLE EAR SURGERY          Home Medications    Prior to Admission medications   Medication Sig Start Date End Date Taking? Authorizing Provider  acetaminophen (TYLENOL) 500 MG tablet Take 1 tablet (500 mg total) by mouth every 6 (six) hours as needed. 09/16/17   Law, Waylan Boga, PA-C  ALPRAZolam Prudy Feeler) 1 MG tablet Take 1 mg by mouth at bedtime as needed for anxiety.    [provider]  clindamycin (CLEOCIN) 150 MG capsule Take 3 capsules (450 mg total) by mouth 3 (three) times daily for 7 days. 11/02/17  11/09/17  Maxwell Caul, PA-C  naproxen (NAPROSYN) 375 MG tablet Take 1 tablet (375 mg total) by mouth 2 (two) times daily. 11/02/17   Maxwell Caul, PA-C  oxyCODONE-acetaminophen (PERCOCET) 7.5-325 MG tablet Take 1 tablet by mouth every 4 (four) hours as needed for severe pain.    [provider]  Saccharomyces boulardii (PROBIOTIC) 250 MG CAPS Take 1 capsule by mouth daily. 09/16/17   Law, Waylan Boga, PA-C  traMADol (ULTRAM) 50 MG tablet Take 1 tablet (50 mg total) by mouth every 6 (six) hours as needed. 05/20/16   Geoffery Lyons, MD    Family History No family history on file.  Social History Social History   Tobacco Use  . Smoking status: Current Some Day Smoker    Packs/day: 0.50    Types: Cigarettes  . Smokeless tobacco: Never Used  Substance Use Topics  . Alcohol use: No  . Drug use: Not Currently     Allergies   Amoxicillin and Penicillins   Review of Systems Review of Systems  Constitutional: Positive for fever (resolved).  HENT: Positive for dental problem.   Respiratory: Negative for shortness of breath.   Gastrointestinal: Negative for vomiting.     Physical Exam Updated Vital Signs BP 112/77 (BP Location: Left Arm)   Pulse 62   Temp 98.3 F (  36.8 C) (Oral)   Resp 16   Ht  (1.702 m)   Wt 95.3 kg (210 lb)   SpO2 100%   BMI 32.89 kg/m   Physical Exam  Constitutional: He appears well-developed and well-nourished.  HENT:  Head: Normocephalic and atraumatic.  Mouth/Throat: Uvula is midline, oropharynx is clear and moist and mucous membranes are normal. No trismus in the jaw. Abnormal dentition.    Diffuse dental caries throughout.  Uvula is midline.  No trismus.  Face is symmetric in appearance and no appreciable swelling.  No oral angioedema.  Airways patent, phonation is intact.  Eyes: Conjunctivae and EOM are normal. Right eye exhibits no discharge. Left eye exhibits no discharge. No scleral icterus.  Pulmonary/Chest: Effort  normal.  Neurological: He is alert.  Skin: Skin is warm and dry.  Psychiatric: He has a normal mood and affect. His speech is normal and behavior is normal.  Nursing note and vitals reviewed.    ED Treatments / Results  Labs (all labs ordered are listed, but only abnormal results are displayed) Labs Reviewed - No data to display  EKG None  Radiology No results found.  Procedures Procedures (including critical care time)  Medications Ordered in ED Medications - No data to display   Initial Impression / Assessment and Plan / ED Course  I have reviewed the triage vital signs and the nursing notes.  Pertinent labs & imaging results that were available during my care of the patient were reviewed by me and considered in my medical decision making (see chart for details).     37 y.o. M presents with 1 day of dental pain.  Patient states he thinks he cracked the tooth on some hard candy yesterday.  Reports fever yesterday but denies any fever today.  On initial ED arrival, patient is afebrile.  Vital signs reviewed and stable.  On exam, patient has diffuse dental caries noted throughout.  He has partially cracked molars on the right upper sign.  No evidence of abscess requiring immediate incision and drainage. Exam not concerning for Ludwig's angina or pharyngeal abscess. Will treat with clindamycin as patient is allergic to penicillins and amoxicillin. Patient instructed to follow-up with dentist referral provided. Stable for discharge at this time. Patient had ample opportunity for questions and discussion. All patient's questions were answered with full understanding. Strict return precautions discussed. Patient expresses understanding and agreement to plan.   Final Clinical Impressions(s) / ED Diagnoses   Final diagnoses:  Pain, dental    ED Discharge Orders        Ordered    clindamycin (CLEOCIN) 150 MG capsule  3 times daily     11/02/17 1417    naproxen (NAPROSYN) 375 MG  tablet  2 times daily     11/02/17 1417       Maxwell Caul, PA-C 11/02/17 1553    Tegeler, Canary Brim, MD 11/02/17 [phone]

## 2018-05-15 ENCOUNTER — Emergency Department (HOSPITAL_BASED_OUTPATIENT_CLINIC_OR_DEPARTMENT_OTHER)
Admission: EM | Admit: 2018-05-15 | Discharge: 2018-05-15 | Disposition: A | Discharge status: Home / Self Care | Payer: Medicare Other | Attending: Emergency Medicine | Admitting: Emergency Medicine

## 2018-05-15 ENCOUNTER — Encounter (HOSPITAL_BASED_OUTPATIENT_CLINIC_OR_DEPARTMENT_OTHER): Payer: Self-pay | Admitting: *Deleted

## 2018-05-15 ENCOUNTER — Other Ambulatory Visit: Payer: Self-pay

## 2018-05-15 DIAGNOSIS — F17210 Nicotine dependence, cigarettes, uncomplicated: Secondary | ICD-10-CM | POA: Insufficient documentation

## 2018-05-15 DIAGNOSIS — K0889 Other specified disorders of teeth and supporting structures: Secondary | ICD-10-CM | POA: Diagnosis present

## 2018-05-15 DIAGNOSIS — Z79899 Other long term (current) drug therapy: Secondary | ICD-10-CM | POA: Diagnosis not present

## 2018-05-15 DIAGNOSIS — K029 Dental caries, unspecified: Secondary | ICD-10-CM | POA: Insufficient documentation

## 2018-05-15 DIAGNOSIS — K047 Periapical abscess without sinus: Secondary | ICD-10-CM | POA: Insufficient documentation

## 2018-05-15 MED ORDER — CLINDAMYCIN HCL 150 MG PO CAPS
300.0000 mg | ORAL_CAPSULE | Freq: Once | ORAL | Status: AC
  Administered 2018-05-15: 300 mg via ORAL
  Filled 2018-05-15: qty 2

## 2018-05-15 MED ORDER — HYDROCODONE-ACETAMINOPHEN 7.5-325 MG PO TABS: 1.0000 | ORAL_TABLET | Freq: Four times a day (QID) | ORAL | 0 refills | Status: DC | PRN

## 2018-05-15 MED ORDER — OXYCODONE-ACETAMINOPHEN 5-325 MG PO TABS
2.0000 | ORAL_TABLET | Freq: Once | ORAL | Status: AC
  Administered 2018-05-15: 2 via ORAL
  Filled 2018-05-15: qty 2

## 2018-05-15 MED ORDER — CLINDAMYCIN HCL 300 MG PO CAPS: 300.0000 mg | ORAL_CAPSULE | Freq: Four times a day (QID) | ORAL | 0 refills | Status: DC

## 2018-05-15 NOTE — ED Triage Notes (Signed)
Pt c/o dental pain x 3 days , pt is out of Lortab 7.5mg  from dentist

## 2018-05-15 NOTE — ED Provider Notes (Signed)
[facility] EMERGENCY DEPARTMENT Provider Note   CSN: [id number] Arrival date & time: 05/15/18  1948     History   Chief Complaint Chief Complaint  Patient presents with  . Dental Pain    HPI Jeffrey Crawford is a 37 y.o. male history of PTSD, dentition here presenting with toothache.  Patient states that he has bad teeth and has multiple tooth extracted.  Patient states that for the last 3 to 4 days, he has been having toothache in the left upper jaw area.  Denies any fevers or chills.  States that he had trouble sleeping due to the pain.  Denies any purulent discharge.  Patient has follow-up with oral surgery already and was prescribed 20 pills of lortab on 10/30 and ran out. Patient has follow up with oral surgeon next week.   The history is provided by the patient.    Past Medical History:  Diagnosis Date  . Chronic back pain   . Chronic prescription opiate use   . Drug-seeking behavior   . Gout   . PTSD (post-traumatic stress disorder)     Patient Active Problem List   Diagnosis Date Noted  . No significant past medical history   . PTSD (post-traumatic stress disorder)     Past Surgical History:  Procedure Laterality Date  . KNEE SURGERY Bilateral   . MIDDLE EAR SURGERY          Home Medications    Prior to Admission medications   Medication Sig Start Date End Date Taking? Authorizing Provider  acetaminophen (TYLENOL) 500 MG tablet Take 1 tablet (500 mg total) by mouth every 6 (six) hours as needed. 09/16/17   Law, Waylan BogaAlexandra M, PA-C  ALPRAZolam Prudy Feeler(XANAX) 1 MG tablet Take 1 mg by mouth at bedtime as needed for anxiety.    [provider]  naproxen (NAPROSYN) 375 MG tablet Take 1 tablet (375 mg total) by mouth 2 (two) times daily. 11/02/17   Maxwell CaulLayden, Lindsey A, PA-C  oxyCODONE-acetaminophen (PERCOCET) 7.5-325 MG tablet Take 1 tablet by mouth every 4 (four) hours as needed for severe pain.    [provider]  Saccharomyces boulardii  (PROBIOTIC) 250 MG CAPS Take 1 capsule by mouth daily. 09/16/17   Law, Waylan BogaAlexandra M, PA-C  traMADol (ULTRAM) 50 MG tablet Take 1 tablet (50 mg total) by mouth every 6 (six) hours as needed. 05/20/16   Geoffery Lyonselo, Douglas, MD    Family History History reviewed. No pertinent family history.  Social History Social History   Tobacco Use  . Smoking status: Current Some Day Smoker    Packs/day: 0.50    Types: Cigarettes  . Smokeless tobacco: Never Used  Substance Use Topics  . Alcohol use: No  . Drug use: Not Currently     Allergies   Amoxicillin and Penicillins   Review of Systems Review of Systems  HENT:       Toothache   All other systems reviewed and are negative.    Physical Exam Updated Vital Signs BP 134/87   Pulse 81   Temp 98.8 F (37.1 C) (Oral)   Resp 18   Ht 5\' 6"  (1.676 m)   Wt 95 kg   SpO2 99%   BMI 33.80 kg/m   Physical Exam  Constitutional: He is oriented to person, place, and time. He appears well-developed.  HENT:  Head: Normocephalic.  Poor dentition. L upper premolar with large cavity, some periapical swelling, no fluctuance. No posterior pharynx swelling. Floor of mouth soft  Eyes: Pupils are equal, round, and reactive to light. Conjunctivae and EOM are normal.  Neck: Normal range of motion. Neck supple.  No cervical LAD   Cardiovascular: Normal rate, regular rhythm and normal heart sounds.  Pulmonary/Chest: Effort normal and breath sounds normal. No stridor. No respiratory distress.  Abdominal: Soft. Bowel sounds are normal. He exhibits no distension.  Musculoskeletal: Normal range of motion.  Neurological: He is alert and oriented to person, place, and time. No cranial nerve deficit. Coordination normal.  Skin: Skin is warm.  Psychiatric: He has a normal mood and affect.  Nursing note and vitals reviewed.    ED Treatments / Results  Labs (all labs ordered are listed, but only abnormal results are displayed) Labs Reviewed - No data to  display  EKG None  Radiology No results found.  Procedures Procedures (including critical care time)  Medications Ordered in ED Medications  clindamycin (CLEOCIN) capsule 300 mg (has no administration in time range)  oxyCODONE-acetaminophen (PERCOCET/ROXICET) 5-325 MG per tablet 2 tablet (has no administration in time range)     Initial Impression / Assessment and Plan / ED Course  I have reviewed the triage vital signs and the nursing notes.  Pertinent labs & imaging results that were available during my care of the patient were reviewed by me and considered in my medical decision making (see chart for details).    Jeffrey Crawford is a 37 y.o. male here with dental pain. This is a recurrent problem. There is some swelling L premolar area, no obvious periapical abscess. Will give short course of clindamycin. Has oral surgery follow up next week. Told him that I can only give 5 lortab prescriptions to go home with.    Final Clinical Impressions(s) / ED Diagnoses   Final diagnoses:  None    ED Discharge Orders    None       Charlynne Pander, MD 05/15/18 2037

## 2018-05-15 NOTE — Discharge Instructions (Signed)
See your oral surgeon next week.   Take clindamycin as prescribed.   Take lortab for severe pain   Return to ER if you have worse dental pain or swelling, fever, trouble swallowing.

## 2019-04-19 ENCOUNTER — Emergency Department (HOSPITAL_BASED_OUTPATIENT_CLINIC_OR_DEPARTMENT_OTHER): Payer: Medicare Other

## 2019-04-19 ENCOUNTER — Other Ambulatory Visit: Payer: Self-pay

## 2019-04-19 ENCOUNTER — Encounter (HOSPITAL_BASED_OUTPATIENT_CLINIC_OR_DEPARTMENT_OTHER): Payer: Self-pay | Admitting: *Deleted

## 2019-04-19 ENCOUNTER — Emergency Department (HOSPITAL_BASED_OUTPATIENT_CLINIC_OR_DEPARTMENT_OTHER)
Admission: EM | Admit: 2019-04-19 | Discharge: 2019-04-19 | Disposition: A | Discharge status: Home / Self Care | Payer: Medicare Other | Attending: Emergency Medicine | Admitting: Emergency Medicine

## 2019-04-19 DIAGNOSIS — F17210 Nicotine dependence, cigarettes, uncomplicated: Secondary | ICD-10-CM | POA: Insufficient documentation

## 2019-04-19 DIAGNOSIS — S99911A Unspecified injury of right ankle, initial encounter: Secondary | ICD-10-CM | POA: Diagnosis present

## 2019-04-19 DIAGNOSIS — S93401A Sprain of unspecified ligament of right ankle, initial encounter: Secondary | ICD-10-CM | POA: Diagnosis not present

## 2019-04-19 DIAGNOSIS — Z880 Allergy status to penicillin: Secondary | ICD-10-CM | POA: Insufficient documentation

## 2019-04-19 DIAGNOSIS — Z79899 Other long term (current) drug therapy: Secondary | ICD-10-CM | POA: Diagnosis not present

## 2019-04-19 DIAGNOSIS — W010XXA Fall on same level from slipping, tripping and stumbling without subsequent striking against object, initial encounter: Secondary | ICD-10-CM | POA: Diagnosis not present

## 2019-04-19 DIAGNOSIS — Y929 Unspecified place or not applicable: Secondary | ICD-10-CM | POA: Diagnosis not present

## 2019-04-19 DIAGNOSIS — Y93E1 Activity, personal bathing and showering: Secondary | ICD-10-CM | POA: Diagnosis not present

## 2019-04-19 DIAGNOSIS — Y999 Unspecified external cause status: Secondary | ICD-10-CM | POA: Diagnosis not present

## 2019-04-19 MED ORDER — IBUPROFEN 800 MG PO TABS
800.0000 mg | ORAL_TABLET | Freq: Once | ORAL | Status: AC
  Administered 2019-04-19: 17:00:00 800 mg via ORAL
  Filled 2019-04-19: qty 1

## 2019-04-19 NOTE — ED Provider Notes (Signed)
[facility] EMERGENCY DEPARTMENT Provider Note   CSN: [id number] Arrival date & time: 04/19/19  1545     History   Chief Complaint Chief Complaint  Patient presents with  . Fall    HPI Jeffrey Crawford is a 38 y.o. male with PMH/o Chronic back pain, Gout, PTSD, who presents for evaluation of right ankle pain status post mechanical fall.  Patient reports that he was in the shower and states that there is an area where the ground is cracked and is uneven.  Reports he tripped, causing him to fall.  He states that when he fell, he twisted his ankle but is unsure of how he twisted it.  He states he did not hit the ground and states that the glass wall prevented him from falling.  He denies any head injury, LOC.  He states that since then, he has had pain to the medial malleolus of his right ankle and states that it radiates to the lateral aspect as well to the posterior.  He states he has been able to limp and put some weight on it.  He denies any numbness/weakness.     The history is provided by the patient.    Past Medical History:  Diagnosis Date  . Chronic back pain   . Chronic prescription opiate use   . Drug-seeking behavior   . Gout   . PTSD (post-traumatic stress disorder)     Patient Active Problem List   Diagnosis Date Noted  . No significant past medical history   . PTSD (post-traumatic stress disorder)     Past Surgical History:  Procedure Laterality Date  . KNEE SURGERY Bilateral   . MIDDLE EAR SURGERY          Home Medications    Prior to Admission medications   Medication Sig Start Date End Date Taking? Authorizing Provider  acetaminophen (TYLENOL) 500 MG tablet Take 1 tablet (500 mg total) by mouth every 6 (six) hours as needed. 09/16/17   Law, Bea Graff, PA-C  ALPRAZolam Duanne Moron) 1 MG tablet Take 1 mg by mouth at bedtime as needed for anxiety.    [provider]  clindamycin (CLEOCIN) 300 MG capsule Take 1 capsule (300 mg total) by  mouth 4 (four) times daily. X 7 days 05/15/18   Drenda Freeze, MD  HYDROcodone-acetaminophen (LORTAB) 7.5-325 MG tablet Take 1 tablet by mouth every 6 (six) hours as needed for moderate pain. 05/15/18   Drenda Freeze, MD  naproxen (NAPROSYN) 375 MG tablet Take 1 tablet (375 mg total) by mouth 2 (two) times daily. 11/02/17   Volanda Napoleon, PA-C  oxyCODONE-acetaminophen (PERCOCET) 7.5-325 MG tablet Take 1 tablet by mouth every 4 (four) hours as needed for severe pain.    [provider]  Saccharomyces boulardii (PROBIOTIC) 250 MG CAPS Take 1 capsule by mouth daily. 09/16/17   Law, Bea Graff, PA-C  traMADol (ULTRAM) 50 MG tablet Take 1 tablet (50 mg total) by mouth every 6 (six) hours as needed. 05/20/16   Veryl Speak, MD    Family History No family history on file.  Social History Social History   Tobacco Use  . Smoking status: Current Some Day Smoker    Packs/day: 0.50    Types: Cigarettes  . Smokeless tobacco: Never Used  Substance Use Topics  . Alcohol use: No  . Drug use: Not Currently     Allergies   Amoxicillin and Penicillins   Review of Systems Review of Systems  Musculoskeletal:       Right ankle pain  Neurological: Negative for weakness and numbness.  All other systems reviewed and are negative.    Physical Exam Updated Vital Signs BP 127/76 (BP Location: Left Arm)   Pulse 64   Temp 97.9 F (36.6 C) (Oral)   Resp 16   Ht 5\' 7"  (1.702 m)   Wt 83.9 kg   SpO2 100%   BMI 28.98 kg/m   Physical Exam Vitals signs and nursing note reviewed.  Constitutional:      Appearance: He is well-developed.  HENT:     Head: Normocephalic and atraumatic.  Neck:     Musculoskeletal: Normal range of motion.  Cardiovascular:     Pulses:          Dorsalis pedis pulses are 2+ on the right side and 2+ on the left side.  Pulmonary:     Effort: Pulmonary effort is normal.  Musculoskeletal:     Comments: Tenderness palpation noted to the medial  malleolus of the right ankle.  He also has some tenderness palpation in the lateral malleolus.  No deformity or crepitus noted.  No overlying ecchymosis, warmth, erythema.  No deficit noted with palpation of the Achilles tendon.  Dorsiflexion and plantar flexion intact but with some subjective reports of pain.  No bony tenderness noted to distal tib-fib, proximal tib-fib, knee, femur, hip.  Skin:    General: Skin is warm and dry.     Capillary Refill: Capillary refill takes less than 2 seconds.     Comments: The skin is intact to ankle/foot.  The foot is warm and well perfused with intact sensation  Neurological:     Comments: Sensation intact throughout all major nerve distributions of the feet       ED Treatments / Results  Labs (all labs ordered are listed, but only abnormal results are displayed) Labs Reviewed - No data to display  EKG None  Radiology Dg Ankle Complete Right  Result Date: 04/19/2019 CLINICAL DATA:  Ankle pain after fall EXAM: RIGHT ANKLE - COMPLETE 3+ VIEW COMPARISON:  None. FINDINGS: No fracture or malalignment. Ankle mortise is symmetric. There is mild soft tissue swelling IMPRESSION: No acute osseous abnormality Electronically Signed   By: Jasmine PangKim  Fujinaga M.D.   On: 04/19/2019 16:28    Procedures Procedures (including critical care time)  Medications Ordered in ED Medications  ibuprofen (ADVIL) tablet 800 mg (800 mg Oral Given 04/19/19 1642)     Initial Impression / Assessment and Plan / ED Course  I have reviewed the triage vital signs and the nursing notes.  Pertinent labs & imaging results that were available during my care of the patient were reviewed by me and considered in my medical decision making (see chart for details).        38 y.o. M Presents with ankle pain consistent with an ankle sprain/strain.  Vital signs reviewed and stable. Patient is neurovascularly intact. Consider sprain vs fracture vs dislocation.  XRs ordered.   XR reviewed.  Negative for any acute fracture or dislocation. Explained results to patient and discussed that there could be a ligamentous or muscular injury that cannot be picked up on XR. Plan to send patient home with a ASO splint and crutches. At this time, patient exhibits no emergent life-threatening condition that require further evaluation in ED or admission. Patient had ample opportunity for questions and discussion. All patient's questions were answered with full understanding. Strict return precautions discussed. Patient expresses understanding  and agreement to plan.   Portions of this note were generated with Scientist, clinical (histocompatibility and immunogenetics). Dictation errors may occur despite best attempts at proofreading.   Final Clinical Impressions(s) / ED Diagnoses   Final diagnoses:  Sprain of right ankle, unspecified ligament, initial encounter    ED Discharge Orders    None       Maxwell Caul, PA-C 04/19/19 1707    Geoffery Lyons, MD 04/19/19 2346

## 2019-04-19 NOTE — ED Triage Notes (Addendum)
Pt c/o fall from standing injuring right ankle x 1 hr ago pt states abrasion to ankle

## 2019-04-19 NOTE — Discharge Instructions (Addendum)
Follow up with your Primary Care Doctor as needed.   You can take Tylenol or Ibuprofen as directed for pain. You can alternate Tylenol and Ibuprofen every 4 hours. If you take Tylenol at 1pm, then you can take Ibuprofen at 5pm. Then you can take Tylenol again at 9pm. Do not exceed 4000 mg of tylenol a day. Do not exceed 800 mg of ibuprofen a day.     Return to the Emergency Department immediately for any worsening pain, redness/swelling of the ankle, gray or blue color to the toes, numbness/weakness of toes or foot, difficulty walking or any other worsening or concerning symptoms.    Ankle sprain Ankle sprain occurs when the ligaments that hold the ankle joint to get her are stretched or torn. It may take 4-6 weeks to heal.  For activity: Use crutches with nonweightbearing for the first few days. Then, you may walk on your ankles as the pain allows, or as instructed. Start gradually with weight bearing on the affected ankle. Once you can walk pain free, then try jogging. When you can run forwards, then you can try moving side to side. If you cannot walk without crutches in one week, you need a recheck by your Family Doctor.  If you do not have a family doctor to followup with, you can see the list of phone numbers below. Please call today to make a followup appointment.   RICE therapy:  Routine Care for injuries  Rest, Ice, Compression, Elevation (RICE)  Rest is needed to allow your body to heal. Routine activities can be resumed when comfortable. Injury tendons and bones can take up to 6 weeks to heal. Tendons are cordlike structures that attach muscles and bones.  Ice following an injury helps keep the swelling down and reduce the pain. Put ice in a plastic bag. Place a towel between your skin and the bag of ice. Leave the ice on for 15-20 minutes, 3-4 times a day. Do this while awake, for the first 24-48 hours. After that continue as directed by your caregiver.  Compression helps keep  swelling down. It also gives support and helps with discomfort. If any lasting bandage has been applied, it should be removed and reapplied every 3-4 hours. It should not be applied tightly, but firmly enough to keep swelling down. Watch fingers or toes for swelling, discoloration, coldness, numbness or excessive pain. If any of these problems occur, removed the bandage and reapply loosely. Contact your caregiver if these problems continue.  Elevation helps reduce swelling and decrease your pain. With extremities such as the arms, hands, legs and feet, the injured area should be placed near or above the level of the heart if possible.   

## 2019-05-02 ENCOUNTER — Emergency Department (HOSPITAL_BASED_OUTPATIENT_CLINIC_OR_DEPARTMENT_OTHER)
Admission: EM | Admit: 2019-05-02 | Discharge: 2019-05-02 | Disposition: A | Discharge status: Home / Self Care | Payer: Medicare Other | Attending: Emergency Medicine | Admitting: Emergency Medicine

## 2019-05-02 ENCOUNTER — Encounter (HOSPITAL_BASED_OUTPATIENT_CLINIC_OR_DEPARTMENT_OTHER): Payer: Self-pay | Admitting: Emergency Medicine

## 2019-05-02 ENCOUNTER — Other Ambulatory Visit: Payer: Self-pay

## 2019-05-02 ENCOUNTER — Emergency Department (HOSPITAL_BASED_OUTPATIENT_CLINIC_OR_DEPARTMENT_OTHER): Payer: Medicare Other

## 2019-05-02 DIAGNOSIS — S60222A Contusion of left hand, initial encounter: Secondary | ICD-10-CM | POA: Insufficient documentation

## 2019-05-02 DIAGNOSIS — Y999 Unspecified external cause status: Secondary | ICD-10-CM | POA: Diagnosis not present

## 2019-05-02 DIAGNOSIS — Z79899 Other long term (current) drug therapy: Secondary | ICD-10-CM | POA: Diagnosis not present

## 2019-05-02 DIAGNOSIS — S6992XA Unspecified injury of left wrist, hand and finger(s), initial encounter: Secondary | ICD-10-CM | POA: Diagnosis present

## 2019-05-02 DIAGNOSIS — F17210 Nicotine dependence, cigarettes, uncomplicated: Secondary | ICD-10-CM | POA: Diagnosis not present

## 2019-05-02 DIAGNOSIS — Y9389 Activity, other specified: Secondary | ICD-10-CM | POA: Insufficient documentation

## 2019-05-02 DIAGNOSIS — Y9289 Other specified places as the place of occurrence of the external cause: Secondary | ICD-10-CM | POA: Diagnosis not present

## 2019-05-02 DIAGNOSIS — W19XXXA Unspecified fall, initial encounter: Secondary | ICD-10-CM | POA: Insufficient documentation

## 2019-05-02 NOTE — ED Provider Notes (Signed)
[facility] EMERGENCY DEPARTMENT Provider Note   CSN: [id number] Arrival date & time: 05/02/19  1207     History   Chief Complaint Chief Complaint  Patient presents with  . Hand Injury    HPI Jeffrey Crawford is a 38 y.o. male.  Right-hand-dominant.  He said he fell yesterday and injured his left hand.  Complaining of pain with the fourth and fifth metacarpals.  Associated with some tingling.  No other complaints.     The history is provided by the patient.  Hand Injury Location:  Hand Hand location:  L hand Injury: yes   Time since incident:  24 hours Mechanism of injury: fall   Fall:    Fall occurred:  [employer] of impact:  Hands Pain details:    Quality:  Throbbing   Radiates to:  Does not radiate   Severity:  Moderate   Onset quality:  Sudden   Timing:  Constant   Progression:  Unchanged Handedness:  Right-handed Dislocation: no   Foreign body present:  No foreign bodies Associated symptoms: tingling   Associated symptoms: no back pain, no fever, no numbness and no swelling     Past Medical History:  Diagnosis Date  . Chronic back pain   . Chronic prescription opiate use   . Drug-seeking behavior   . Gout   . PTSD (post-traumatic stress disorder)     Patient Active Problem List   Diagnosis Date Noted  . No significant past medical history   . PTSD (post-traumatic stress disorder)     Past Surgical History:  Procedure Laterality Date  . KNEE SURGERY Bilateral   . MIDDLE EAR SURGERY          Home Medications    Prior to Admission medications   Medication Sig Start Date End Date Taking? Authorizing Provider  acetaminophen (TYLENOL) 500 MG tablet Take 1 tablet (500 mg total) by mouth every 6 (six) hours as needed. 09/16/17   Law, Bea Graff, PA-C  ALPRAZolam Duanne Moron) 1 MG tablet Take 1 mg by mouth at bedtime as needed for anxiety.    [provider]  Saccharomyces boulardii (PROBIOTIC) 250 MG CAPS Take 1 capsule by  mouth daily. 09/16/17   Frederica Kuster, PA-C    Family History No family history on file.  Social History Social History   Tobacco Use  . Smoking status: Current Some Day Smoker    Packs/day: 0.50    Types: Cigarettes  . Smokeless tobacco: Never Used  Substance Use Topics  . Alcohol use: No  . Drug use: Not Currently     Allergies   Amoxicillin and Penicillins   Review of Systems Review of Systems  Constitutional: Negative for fever.  Musculoskeletal: Negative for back pain.     Physical Exam Updated Vital Signs BP 121/79 (BP Location: Right Arm)   Pulse 84   Temp 98.6 F (37 C) (Oral)   Resp 18   Ht 5\' 7"  (1.702 m)   Wt 83.9 kg   SpO2 100%   BMI 28.98 kg/m   Physical Exam Vitals signs and nursing note reviewed.  Constitutional:      Appearance: He is well-developed.  HENT:     Head: Normocephalic and atraumatic.  Eyes:     Conjunctiva/sclera: Conjunctivae normal.  Neck:     Musculoskeletal: Neck supple.  Pulmonary:     Effort: Pulmonary effort is normal.  Musculoskeletal:        General: Tenderness and signs  of injury present.     Comments: He has full range of motion of the shoulder elbow and wrist on the left.  He has limited range of motion of his digits primarily of the fourth and fifth.  He is diffusely tender over the dorsum of his hand.  There are no open wounds.  Cap refill brisk.  Skin:    General: Skin is warm and dry.  Neurological:     Mental Status: He is alert.     GCS: GCS eye subscore is 4. GCS verbal subscore is 5. GCS motor subscore is 6.      ED Treatments / Results  Labs (all labs ordered are listed, but only abnormal results are displayed) Labs Reviewed - No data to display  EKG None  Radiology Dg Hand Complete Left  Result Date: 05/02/2019 CLINICAL DATA:  LEFT hand pain and tenderness laterally after falling onto LEFT hand yesterday EXAM: LEFT HAND - COMPLETE 3+ VIEW COMPARISON:  None FINDINGS: Osseous  mineralization normal. Joint spaces preserved. No acute fracture, dislocation, or bone destruction. 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 IMPRESSION: No acute abnormalities. 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 Electronically Signed   By: Ulyses Southward M.D.   On: 05/02/2019 12:51    Procedures Procedures (including critical care time)  Medications Ordered in ED Medications - No data to display   Initial Impression / Assessment and Plan / ED Course  I have reviewed the triage vital signs and the nursing notes.  Pertinent labs & imaging results that were available during my care of the patient were reviewed by me and considered in my medical decision making (see chart for details).  Clinical Course as of May 01 1857  Tue May 02, 2019  [id number] 38 year old right-hand-dominant male here with left hand pain after a fall yesterday.  X-rays are negative for fracture.  Will treat conservatively with a Velcro splint and ice.  He understands to follow-up with his doctor return if any worsening symptoms.   [MB]    Clinical Course User Index [MB] Terrilee Files, MD        Final Clinical Impressions(s) / ED Diagnoses   Final diagnoses:  Contusion of left hand, initial encounter    ED Discharge Orders    None       Terrilee Files, MD 05/02/19 1859

## 2019-05-02 NOTE — Discharge Instructions (Addendum)
You were seen in the emergency department for evaluation of a left hand injury after a fall.  Your x-rays did not show any fracture or dislocation.  Please use Tylenol or ibuprofen for pain and ice the affected area.  Splint for comfort.  Follow-up with your doctor or return to the emergency department if any worsening symptoms.

## 2019-05-02 NOTE — ED Triage Notes (Signed)
Pt fell yesterday while playing with his grandson. C/o L hand pain.

## 2019-05-02 NOTE — ED Notes (Signed)
Pt refused splint , c/o increased pain , MD made aware , splint is for comfort only

## 2020-09-10 ENCOUNTER — Encounter (HOSPITAL_BASED_OUTPATIENT_CLINIC_OR_DEPARTMENT_OTHER): Payer: Self-pay | Admitting: *Deleted

## 2020-09-10 ENCOUNTER — Emergency Department (HOSPITAL_BASED_OUTPATIENT_CLINIC_OR_DEPARTMENT_OTHER)
Admission: EM | Admit: 2020-09-10 | Discharge: 2020-09-10 | Disposition: A | Discharge status: Home / Self Care | Payer: Medicare Other | Attending: Emergency Medicine | Admitting: Emergency Medicine

## 2020-09-10 ENCOUNTER — Other Ambulatory Visit: Payer: Self-pay

## 2020-09-10 DIAGNOSIS — F17210 Nicotine dependence, cigarettes, uncomplicated: Secondary | ICD-10-CM | POA: Insufficient documentation

## 2020-09-10 DIAGNOSIS — H8110 Benign paroxysmal vertigo, unspecified ear: Secondary | ICD-10-CM

## 2020-09-10 DIAGNOSIS — R42 Dizziness and giddiness: Secondary | ICD-10-CM | POA: Diagnosis present

## 2020-09-10 MED ORDER — MECLIZINE HCL 12.5 MG PO TABS: 12.5000 mg | ORAL_TABLET | Freq: Three times a day (TID) | ORAL | 0 refills | Status: AC | PRN

## 2020-09-10 NOTE — ED Triage Notes (Signed)
Dizziness when he wakes in the mornings for the past year.

## 2020-09-10 NOTE — Discharge Instructions (Addendum)
Jeffrey Crawford, it was a pleasure taking care of you. I am diagnosing you with Benign Positional Vertigo. This is due to abnormalities in the inner ear which are affecting your balance. Please use the maneuvers we went over to try and treat this the next time it happens. I will also prescribe a medication, meclizine, which you can use as needed for vertigo.

## 2020-09-10 NOTE — ED Notes (Signed)
Pt discharged to home. Discharge instructions have been discussed with patient and/or family members. Pt verbally acknowledges understanding d/c instructions, and endorses comprehension to checkout at registration before leaving.  °

## 2020-09-10 NOTE — ED Provider Notes (Signed)
[facility] EMERGENCY DEPARTMENT Provider Note   CSN: [id number] Arrival date & time: 09/10/20  1057     History Chief complaint: dizziness  Jeffrey Crawford is a 40 y.o. male with hx of PTSD presenting with complaint of room-spinning dizziness. He states this has been occurring roughly once every 3 months over the past 2 years, always in the morning after he wakes up. He experienced the same dizziness upon waking this morning, which improved with rest. He then had a shower and upon getting out had another episode of room-spinning dizziness. States that last night was normal for him, did not do any unusual activities and denies any substance use. He sleeps roughly 4-5 hours per night which is normal for him, he has sleep disturbance from PTSD from seeing his son being coded and ultimately die about 1 year ago. No recent illness. He did have surgery for a ruptured L eardrum at age 18. He also sometimes has lightheadedness when he stands up quickly. This seems distinct from the other dizziness. Denies passing out, headache, seizures, focal weakness, numbness, vision changes, chest pain  Past Medical History:  Diagnosis Date  . Chronic back pain   . Chronic prescription opiate use   . Drug-seeking behavior   . Gout   . PTSD (post-traumatic stress disorder)     Patient Active Problem List   Diagnosis Date Noted  . No significant past medical history   . PTSD (post-traumatic stress disorder)     Past Surgical History:  Procedure Laterality Date  . KNEE SURGERY Bilateral   . MIDDLE EAR SURGERY       No Fhx stroke  Social History   Tobacco Use  . Smoking status: Current Some Day Smoker    Packs/day: 0.50    Types: Cigarettes  . Smokeless tobacco: Never Used  Vaping Use  . Vaping Use: Never used  Substance Use Topics  . Alcohol use: No  . Drug use: Not Currently    Home Medications Prior to Admission medications   Medication Sig Start Date End Date Taking?  Authorizing Provider  ALPRAZolam Prudy Feeler) 1 MG tablet Take 1 mg by mouth at bedtime as needed for anxiety.   Yes [provider]  meclizine (ANTIVERT) 12.5 MG tablet Take 1 tablet (12.5 mg total) by mouth every 8 (eight) hours as needed for dizziness. 09/10/20  Yes Remo Lipps, MD  acetaminophen (TYLENOL) 500 MG tablet Take 1 tablet (500 mg total) by mouth every 6 (six) hours as needed. 09/16/17   Law, Waylan Boga, PA-C  Saccharomyces boulardii (PROBIOTIC) 250 MG CAPS Take 1 capsule by mouth daily. 09/16/17   Emi Holes, PA-C    Allergies    Amoxicillin and Penicillins  Review of Systems   Review of Systems  Eyes: Negative for visual disturbance.  Cardiovascular: Negative for chest pain.  Gastrointestinal: Negative for nausea and vomiting.  Musculoskeletal: Negative for back pain.  Neurological: Positive for dizziness and light-headedness. Negative for seizures, weakness, numbness and headaches.    Physical Exam Updated Vital Signs BP 122/80   Pulse 83   Temp 98.4 F (36.9 C) (Oral)   Resp 20   Ht 5\' 7"  (1.702 m)   Wt 83.7 kg   SpO2 100%   BMI 28.90 kg/m   Physical Exam Vitals and nursing note reviewed.  Constitutional:      General: He is not in acute distress.    Appearance: Normal appearance. He is not ill-appearing.  HENT:  Head: Normocephalic and atraumatic.     Right Ear: Tympanic membrane, ear canal and external ear normal. There is no impacted cerumen.     Left Ear: Ear canal and external ear normal. There is no impacted cerumen.     Ears:     Comments: L TM with evidence of prior rupture    Nose: Nose normal.     Mouth/Throat:     Mouth: Mucous membranes are dry.  Eyes:     Extraocular Movements: Extraocular movements intact.     Conjunctiva/sclera: Conjunctivae normal.     Pupils: Pupils are equal, round, and reactive to light.  Cardiovascular:     Rate and Rhythm: Normal rate and regular rhythm.     Heart sounds: No murmur heard. No  friction rub. No gallop.   Pulmonary:     Effort: Pulmonary effort is normal. No respiratory distress.     Breath sounds: Normal breath sounds. No wheezing, rhonchi or rales.  Abdominal:     General: Abdomen is flat. There is no distension.     Palpations: Abdomen is soft.     Tenderness: There is no abdominal tenderness.  Musculoskeletal:        General: No deformity or signs of injury. Normal range of motion.     Cervical back: Normal range of motion and neck supple.     Right lower leg: No edema.     Left lower leg: No edema.  Skin:    General: Skin is warm and dry.  Neurological:     General: No focal deficit present.     Mental Status: He is alert and oriented to person, place, and time.     Cranial Nerves: No cranial nerve deficit.     Sensory: No sensory deficit.     Motor: No weakness.     Coordination: Coordination normal.     Gait: Gait normal.     Comments: Epley maneuver replicates his vertigo  Psychiatric:        Mood and Affect: Mood normal.        Behavior: Behavior normal.     ED Results / Procedures / Treatments   EKG EKG Interpretation  Date/Time:  Tuesday September 10 2020 11:11:51 EDT Ventricular Rate:  69 PR Interval:  142 QRS Duration: 100 QT Interval:  368 QTC Calculation: 394 R Axis:   43 Text Interpretation: Normal sinus rhythm Normal ECG Confirmed by Tilden Fossa [phone]) on 09/10/2020 12:14:47 PM   ED Course  I have reviewed the triage vital signs and the nursing notes.  Pertinent labs & imaging results that were available during my care of the patient were reviewed by me and considered in my medical decision making (see chart for details).    MDM Rules/Calculators/A&P                          Epley maneuver replicates his vertigo. No recent illness. He does have history of L ear surgery after ruptured eardrum at 40 years old but denies any ear pain, hearing difficulty, tinnitus. Neuro exam is benign. Orthostatics negative. Symptoms most  likely due to benign positional vertigo. Went over modified Epley and modified [place] maneuvers with patient. Discharging with meclizine for [place] prn.   Final Clinical Impression(s) / ED Diagnoses Final diagnoses:  Benign paroxysmal positional vertigo, unspecified laterality    Rx / DC Orders ED Discharge Orders         Ordered  meclizine (ANTIVERT) 12.5 MG tablet  Every 8 hours PRN        09/10/20 1247           Remo Lipps, MD 09/10/20 1315    Tilden Fossa, MD 09/12/20 [phone]

## 2020-09-10 NOTE — ED Notes (Signed)
Pt on monitor and vitals cycling 

## 2020-09-10 NOTE — ED Notes (Signed)
Hold for labs per EDP Josh

## 2020-11-07 ENCOUNTER — Emergency Department (HOSPITAL_BASED_OUTPATIENT_CLINIC_OR_DEPARTMENT_OTHER)
Admission: EM | Admit: 2020-11-07 | Discharge: 2020-11-07 | Disposition: A | Discharge status: Home / Self Care | Payer: Medicare Other

## 2020-11-07 ENCOUNTER — Other Ambulatory Visit: Payer: Self-pay

## 2020-11-07 NOTE — ED Notes (Signed)
Pt called again for triage, no response.

## 2021-04-20 IMAGING — DX DG ANKLE COMPLETE 3+V*R*
3 series · 3 of 3 positions shown · non-contrast
Comparison: None.

CLINICAL DATA: Ankle pain after fall

EXAM:
RIGHT ANKLE - COMPLETE 3+ VIEW

[ankle ap]
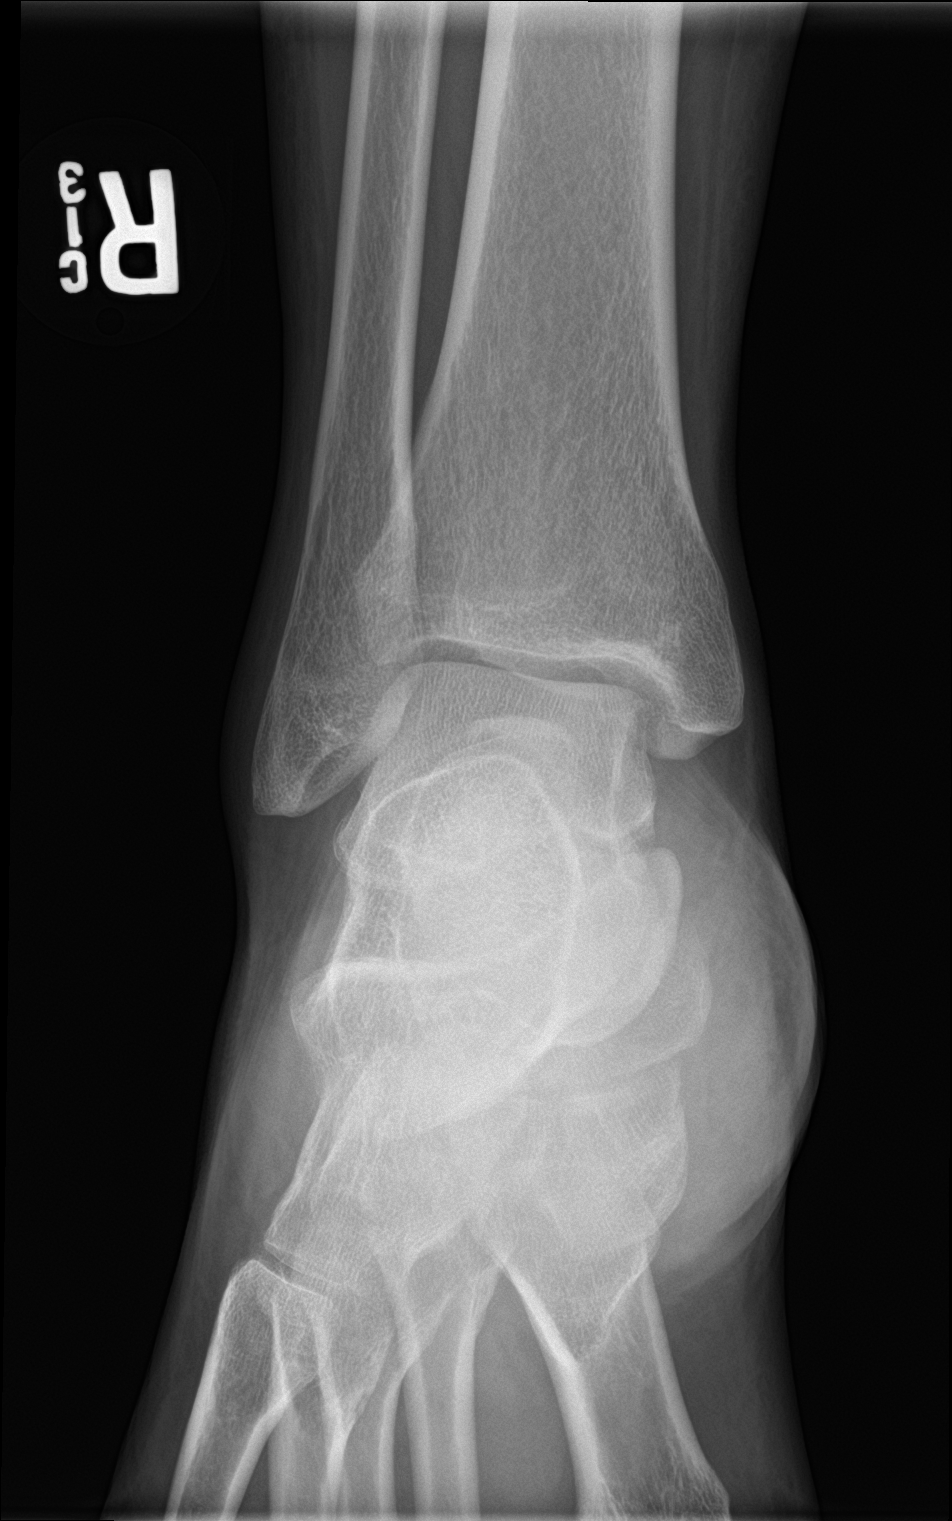

[ankle obl]
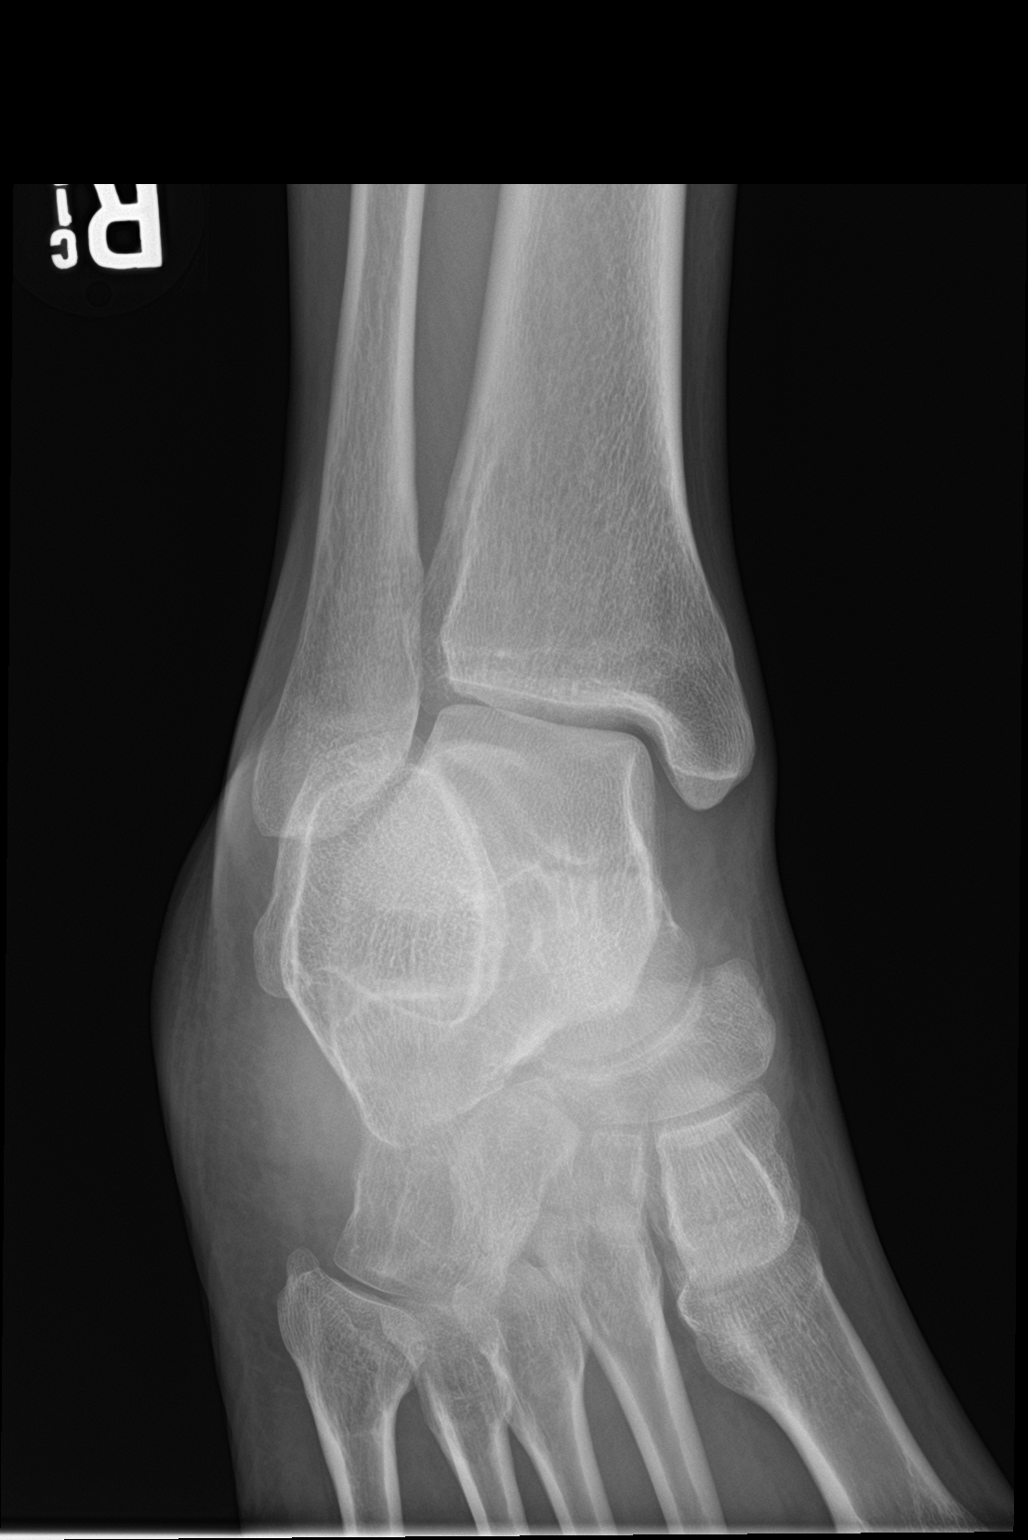

[ankle lat]
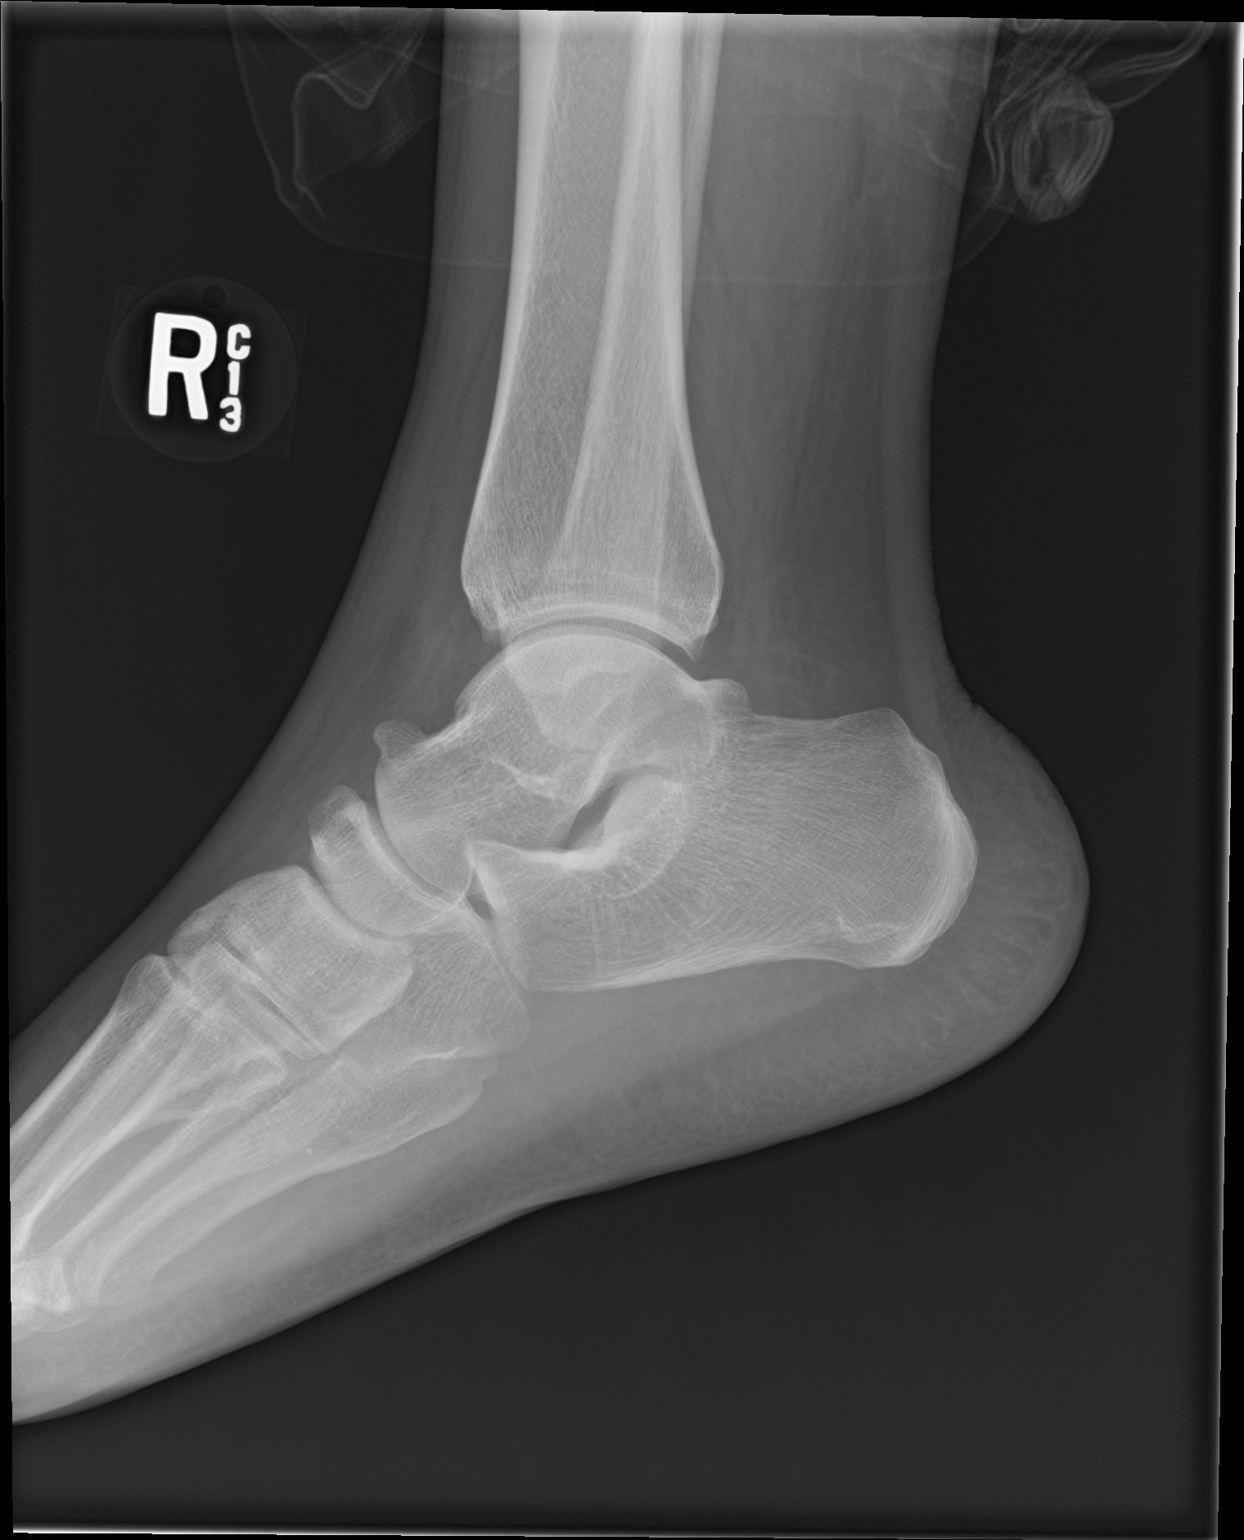

[3 of 3 positions shown; findings below may reference images not displayed]

FINDINGS: No fracture or malalignment. Ankle mortise is symmetric. There is
mild soft tissue swelling
IMPRESSION: No acute osseous abnormality

## 2024-07-14 ENCOUNTER — Other Ambulatory Visit: Payer: Self-pay

## 2024-07-14 ENCOUNTER — Emergency Department (HOSPITAL_BASED_OUTPATIENT_CLINIC_OR_DEPARTMENT_OTHER)
Admission: EM | Admit: 2024-07-14 | Discharge: 2024-07-14 | Discharge status: Against Medical Advice | Attending: Emergency Medicine | Admitting: Emergency Medicine

## 2024-07-14 ENCOUNTER — Encounter (HOSPITAL_BASED_OUTPATIENT_CLINIC_OR_DEPARTMENT_OTHER): Payer: Self-pay | Admitting: Emergency Medicine

## 2024-07-14 DIAGNOSIS — K0889 Other specified disorders of teeth and supporting structures: Secondary | ICD-10-CM | POA: Insufficient documentation

## 2024-07-14 DIAGNOSIS — Z5321 Procedure and treatment not carried out due to patient leaving prior to being seen by health care provider: Secondary | ICD-10-CM | POA: Diagnosis not present

## 2024-07-14 NOTE — ED Triage Notes (Signed)
 Back left tooth pain x 1 month, tooth are chipped upper and lower

## 2024-07-14 NOTE — ED Notes (Signed)
 Pt states he has NO allergies to penicillian

## 2024-07-14 NOTE — ED Notes (Signed)
 Pt calling out saying no one has seen him and he keeps getting louder and louder , security called pt progressed to cursing and getting louder pt states he was going to leave so he did escorted by security
